# Patient Record
Sex: Female | Born: 1950
Health system: Southern US, Community
[De-identification: ages and names within clinical notes are randomized; demographics above are authoritative.]

## PROBLEM LIST (undated history)

## (undated) DIAGNOSIS — I8393 Asymptomatic varicose veins of bilateral lower extremities: Secondary | ICD-10-CM

## (undated) DIAGNOSIS — T4145XA Adverse effect of unspecified anesthetic, initial encounter: Secondary | ICD-10-CM

## (undated) DIAGNOSIS — Z9889 Other specified postprocedural states: Secondary | ICD-10-CM

## (undated) DIAGNOSIS — M169 Osteoarthritis of hip, unspecified: Secondary | ICD-10-CM

## (undated) DIAGNOSIS — R7303 Prediabetes: Secondary | ICD-10-CM

## (undated) DIAGNOSIS — E079 Disorder of thyroid, unspecified: Secondary | ICD-10-CM

## (undated) DIAGNOSIS — K5790 Diverticulosis of intestine, part unspecified, without perforation or abscess without bleeding: Secondary | ICD-10-CM

## (undated) DIAGNOSIS — C801 Malignant (primary) neoplasm, unspecified: Secondary | ICD-10-CM

## (undated) DIAGNOSIS — M1990 Unspecified osteoarthritis, unspecified site: Secondary | ICD-10-CM

## (undated) DIAGNOSIS — R112 Nausea with vomiting, unspecified: Secondary | ICD-10-CM

## (undated) DIAGNOSIS — O1490 Unspecified pre-eclampsia, unspecified trimester: Secondary | ICD-10-CM

## (undated) DIAGNOSIS — T8859XA Other complications of anesthesia, initial encounter: Secondary | ICD-10-CM

## (undated) HISTORY — PX: REDUCTION MAMMAPLASTY: SUR839

## (undated) HISTORY — DX: Disorder of thyroid, unspecified: E07.9

## (undated) HISTORY — DX: Unspecified pre-eclampsia, unspecified trimester: O14.90

## (undated) HISTORY — PX: INGUINAL HERNIA REPAIR: SUR1180

## (undated) HISTORY — DX: Asymptomatic varicose veins of bilateral lower extremities: I83.93

## (undated) HISTORY — DX: Diverticulosis of intestine, part unspecified, without perforation or abscess without bleeding: K57.90

## (undated) HISTORY — PX: KNEE ARTHROSCOPY: SHX127

## (undated) HISTORY — PX: BREAST BIOPSY: SHX20

## (undated) HISTORY — PX: COLON SURGERY: SHX602

## (undated) HISTORY — PX: COLONOSCOPY: SHX174

## (undated) HISTORY — PX: APPENDECTOMY: SHX54

## (undated) HISTORY — PX: THYROID SURGERY: SHX805

## (undated) HISTORY — PX: OTHER SURGICAL HISTORY: SHX169

## (undated) HISTORY — PX: BREAST REDUCTION SURGERY: SHX8

---

## 1997-12-04 ENCOUNTER — Encounter: Admission: RE | Admit: 1997-12-04 | Discharge: 1997-12-04 | Payer: Self-pay | Admitting: Family Medicine

## 1998-03-30 ENCOUNTER — Encounter: Admission: RE | Admit: 1998-03-30 | Discharge: 1998-03-30 | Payer: Self-pay | Admitting: Family Medicine

## 1998-04-27 ENCOUNTER — Encounter: Admission: RE | Admit: 1998-04-27 | Discharge: 1998-04-27 | Payer: Self-pay | Admitting: Family Medicine

## 1998-12-31 ENCOUNTER — Encounter: Admission: RE | Admit: 1998-12-31 | Discharge: 1998-12-31 | Payer: Self-pay | Admitting: Family Medicine

## 1999-02-04 ENCOUNTER — Encounter: Admission: RE | Admit: 1999-02-04 | Discharge: 1999-02-04 | Payer: Self-pay | Admitting: Family Medicine

## 1999-03-12 ENCOUNTER — Encounter: Payer: Self-pay | Admitting: Family Medicine

## 1999-03-12 ENCOUNTER — Encounter: Admission: RE | Admit: 1999-03-12 | Discharge: 1999-03-12 | Payer: Self-pay | Admitting: Family Medicine

## 1999-07-25 ENCOUNTER — Encounter: Admission: RE | Admit: 1999-07-25 | Discharge: 1999-07-25 | Payer: Self-pay | Admitting: Family Medicine

## 1999-12-27 ENCOUNTER — Encounter: Admission: RE | Admit: 1999-12-27 | Discharge: 1999-12-27 | Payer: Self-pay | Admitting: Family Medicine

## 1999-12-27 ENCOUNTER — Encounter: Payer: Self-pay | Admitting: Family Medicine

## 1999-12-30 ENCOUNTER — Encounter: Admission: RE | Admit: 1999-12-30 | Discharge: 1999-12-30 | Payer: Self-pay | Admitting: Family Medicine

## 2000-11-09 ENCOUNTER — Encounter: Payer: Self-pay | Admitting: Family Medicine

## 2000-11-09 ENCOUNTER — Encounter: Admission: RE | Admit: 2000-11-09 | Discharge: 2000-11-09 | Payer: Self-pay | Admitting: Family Medicine

## 2002-01-28 ENCOUNTER — Encounter: Admission: RE | Admit: 2002-01-28 | Discharge: 2002-01-28 | Payer: Self-pay | Admitting: Family Medicine

## 2002-01-28 ENCOUNTER — Encounter: Payer: Self-pay | Admitting: Sports Medicine

## 2002-01-28 ENCOUNTER — Encounter: Admission: RE | Admit: 2002-01-28 | Discharge: 2002-01-28 | Payer: Self-pay | Admitting: Sports Medicine

## 2003-03-03 ENCOUNTER — Encounter: Admission: RE | Admit: 2003-03-03 | Discharge: 2003-03-03 | Payer: Self-pay | Admitting: Family Medicine

## 2003-03-03 ENCOUNTER — Encounter: Admission: RE | Admit: 2003-03-03 | Discharge: 2003-03-03 | Payer: Self-pay | Admitting: Sports Medicine

## 2003-03-07 ENCOUNTER — Encounter: Admission: RE | Admit: 2003-03-07 | Discharge: 2003-03-07 | Payer: Self-pay | Admitting: Family Medicine

## 2004-03-15 ENCOUNTER — Ambulatory Visit: Payer: Self-pay | Admitting: Family Medicine

## 2004-03-15 ENCOUNTER — Encounter: Admission: RE | Admit: 2004-03-15 | Discharge: 2004-03-15 | Payer: Self-pay | Admitting: Sports Medicine

## 2004-03-28 ENCOUNTER — Encounter (INDEPENDENT_AMBULATORY_CARE_PROVIDER_SITE_OTHER): Payer: Self-pay | Admitting: Specialist

## 2004-03-28 ENCOUNTER — Ambulatory Visit (HOSPITAL_COMMUNITY): Admission: RE | Admit: 2004-03-28 | Discharge: 2004-03-28 | Payer: Self-pay | Admitting: Gastroenterology

## 2004-04-05 ENCOUNTER — Ambulatory Visit: Payer: Self-pay | Admitting: Sports Medicine

## 2005-02-17 ENCOUNTER — Encounter (INDEPENDENT_AMBULATORY_CARE_PROVIDER_SITE_OTHER): Payer: Self-pay | Admitting: *Deleted

## 2005-03-17 ENCOUNTER — Encounter: Admission: RE | Admit: 2005-03-17 | Discharge: 2005-03-17 | Payer: Self-pay | Admitting: Family Medicine

## 2005-03-17 ENCOUNTER — Encounter: Payer: Self-pay | Admitting: Family Medicine

## 2005-03-17 ENCOUNTER — Ambulatory Visit: Payer: Self-pay | Admitting: Family Medicine

## 2006-03-06 ENCOUNTER — Ambulatory Visit: Payer: Self-pay | Admitting: Family Medicine

## 2006-03-23 ENCOUNTER — Ambulatory Visit: Payer: Self-pay | Admitting: Family Medicine

## 2006-04-16 DIAGNOSIS — M479 Spondylosis, unspecified: Secondary | ICD-10-CM | POA: Insufficient documentation

## 2006-04-16 DIAGNOSIS — E7800 Pure hypercholesterolemia, unspecified: Secondary | ICD-10-CM

## 2006-04-16 DIAGNOSIS — F070 Personality change due to known physiological condition: Secondary | ICD-10-CM

## 2006-04-16 DIAGNOSIS — E039 Hypothyroidism, unspecified: Secondary | ICD-10-CM

## 2006-04-16 DIAGNOSIS — K5732 Diverticulitis of large intestine without perforation or abscess without bleeding: Secondary | ICD-10-CM | POA: Insufficient documentation

## 2006-04-17 ENCOUNTER — Encounter (INDEPENDENT_AMBULATORY_CARE_PROVIDER_SITE_OTHER): Payer: Self-pay | Admitting: *Deleted

## 2007-01-28 ENCOUNTER — Ambulatory Visit (HOSPITAL_COMMUNITY): Admission: RE | Admit: 2007-01-28 | Discharge: 2007-01-28 | Payer: Self-pay | Admitting: Family Medicine

## 2007-01-28 ENCOUNTER — Encounter: Payer: Self-pay | Admitting: Family Medicine

## 2007-01-28 ENCOUNTER — Encounter (INDEPENDENT_AMBULATORY_CARE_PROVIDER_SITE_OTHER): Payer: Self-pay | Admitting: *Deleted

## 2007-01-28 ENCOUNTER — Ambulatory Visit: Payer: Self-pay | Admitting: Family Medicine

## 2007-01-28 ENCOUNTER — Encounter (INDEPENDENT_AMBULATORY_CARE_PROVIDER_SITE_OTHER): Payer: Self-pay | Admitting: Family Medicine

## 2007-02-24 ENCOUNTER — Encounter: Payer: Self-pay | Admitting: Family Medicine

## 2007-02-25 ENCOUNTER — Encounter (INDEPENDENT_AMBULATORY_CARE_PROVIDER_SITE_OTHER): Payer: Self-pay | Admitting: *Deleted

## 2007-03-26 ENCOUNTER — Encounter: Payer: Self-pay | Admitting: Family Medicine

## 2007-03-26 ENCOUNTER — Ambulatory Visit: Payer: Self-pay | Admitting: Family Medicine

## 2007-03-26 ENCOUNTER — Encounter: Admission: RE | Admit: 2007-03-26 | Discharge: 2007-03-26 | Payer: Self-pay | Admitting: Family Medicine

## 2007-03-26 LAB — CONVERTED CEMR LAB
Albumin: 4.7 g/dL (ref 3.5–5.2)
Alkaline Phosphatase: 58 units/L (ref 39–117)
BUN: 19 mg/dL (ref 6–23)
Creatinine, Ser: 0.84 mg/dL (ref 0.40–1.20)
HDL: 71 mg/dL (ref >39)
Pap Smear: NORMAL
Total Bilirubin: 0.6 mg/dL (ref 0.3–1.2)
Total Protein: 7.7 g/dL (ref 6.0–8.3)
VLDL: 23 mg/dL (ref 0–40)

## 2007-03-29 ENCOUNTER — Encounter: Payer: Self-pay | Admitting: Family Medicine

## 2007-04-12 ENCOUNTER — Encounter: Payer: Self-pay | Admitting: Family Medicine

## 2007-04-13 ENCOUNTER — Encounter: Payer: Self-pay | Admitting: Family Medicine

## 2007-04-13 ENCOUNTER — Ambulatory Visit (HOSPITAL_COMMUNITY): Admission: RE | Admit: 2007-04-13 | Discharge: 2007-04-13 | Payer: Self-pay | Admitting: Sports Medicine

## 2007-08-24 ENCOUNTER — Ambulatory Visit: Payer: Self-pay | Admitting: Family Medicine

## 2007-08-25 LAB — CONVERTED CEMR LAB: Direct LDL: 127 mg/dL — ABNORMAL HIGH

## 2007-11-30 ENCOUNTER — Ambulatory Visit: Payer: Self-pay | Admitting: Occupational Medicine

## 2008-03-20 ENCOUNTER — Telehealth (INDEPENDENT_AMBULATORY_CARE_PROVIDER_SITE_OTHER): Payer: Self-pay | Admitting: Family Medicine

## 2008-04-05 ENCOUNTER — Encounter: Payer: Self-pay | Admitting: Family Medicine

## 2008-04-05 LAB — CONVERTED CEMR LAB
OCCULT 1: NEGATIVE
OCCULT 3: NEGATIVE

## 2008-04-14 ENCOUNTER — Encounter: Admission: RE | Admit: 2008-04-14 | Discharge: 2008-04-14 | Payer: Self-pay | Admitting: Family Medicine

## 2008-04-14 ENCOUNTER — Ambulatory Visit: Payer: Self-pay | Admitting: Family Medicine

## 2008-04-14 DIAGNOSIS — N959 Unspecified menopausal and perimenopausal disorder: Secondary | ICD-10-CM | POA: Insufficient documentation

## 2008-04-18 ENCOUNTER — Ambulatory Visit: Payer: Self-pay | Admitting: Family Medicine

## 2008-04-18 ENCOUNTER — Encounter: Payer: Self-pay | Admitting: Family Medicine

## 2008-04-18 LAB — CONVERTED CEMR LAB
ALT: 20 units/L (ref 0–35)
AST: 21 units/L (ref 0–37)
Albumin: 4.5 g/dL (ref 3.5–5.2)
BUN: 22 mg/dL (ref 6–23)
CO2: 23 meq/L (ref 19–32)
Glucose, Bld: 94 mg/dL (ref 70–99)
LDL Cholesterol: 120 mg/dL — ABNORMAL HIGH (ref 0–99)
Sodium: 143 meq/L (ref 135–145)
TSH: 0.654 microintl units/mL (ref 0.350–4.50)
Total Bilirubin: 0.6 mg/dL (ref 0.3–1.2)
Total Protein: 6.9 g/dL (ref 6.0–8.3)
Triglycerides: 61 mg/dL (ref <150)
VLDL: 12 mg/dL (ref 0–40)
Vit D, 25-Hydroxy: 30 ng/mL (ref 30–89)

## 2008-11-29 ENCOUNTER — Ambulatory Visit: Payer: Self-pay | Admitting: Family Medicine

## 2009-02-28 ENCOUNTER — Ambulatory Visit: Payer: Self-pay | Admitting: Family Medicine

## 2009-04-03 ENCOUNTER — Encounter: Payer: Self-pay | Admitting: Family Medicine

## 2009-04-13 ENCOUNTER — Ambulatory Visit: Payer: Self-pay | Admitting: Family Medicine

## 2009-04-13 LAB — CONVERTED CEMR LAB
AST: 27 units/L (ref 0–37)
Alkaline Phosphatase: 49 units/L (ref 39–117)
BUN: 20 mg/dL (ref 6–23)
Creatinine, Ser: 0.74 mg/dL (ref 0.40–1.20)
Glucose, Bld: 94 mg/dL (ref 70–99)
HDL: 72 mg/dL (ref >39)
Hemoglobin: 13.2 g/dL (ref 12.0–15.0)
LDL Cholesterol: 117 mg/dL — ABNORMAL HIGH (ref 0–99)
Platelets: 344 10*3/uL (ref 150–400)
Potassium: 4.4 meq/L (ref 3.5–5.3)
RBC: 4.5 M/uL (ref 3.87–5.11)
TSH: 0.53 microintl units/mL (ref 0.350–4.500)
Total Bilirubin: 0.4 mg/dL (ref 0.3–1.2)
Total CHOL/HDL Ratio: 2.9
VLDL: 17 mg/dL (ref 0–40)
WBC: 4.2 10*3/uL (ref 4.0–10.5)

## 2009-11-30 ENCOUNTER — Encounter: Payer: Self-pay | Admitting: Family Medicine

## 2009-12-18 ENCOUNTER — Encounter: Payer: Self-pay | Admitting: Family Medicine

## 2010-03-21 NOTE — Assessment & Plan Note (Signed)
Summary: cpe,df   Vital Signs:  Patient profile:   60 year old female Weight:      157.3 pounds BMI:     26.27 Temp:     97.7 degrees F Pulse rate:   69 / minute BP sitting:   128 / 86  Vitals Entered By: Loralee Pacas CMA (April 13, 2009 10:32 AM)  Serial Vital Signs/Assessments:  Time      Position  BP       Pulse  Resp  Temp     By                     128/86                         Doralee Albino MD   CC:  physical.  History of Present Illness: Feels very good.  Contoling arthritis and borderline Hbp with diet and exercise.  Home BPs have been typically lower than 120/80.  Wants shingles vaccine when turns 59 in 6 months  Some stress - husband with metastatic prostate cancer.  She is managing.  Habits & Providers  Alcohol-Tobacco-Diet     Alcohol drinks/day: 0     Tobacco Status: never     Diet Comments: very health  Exercise-Depression-Behavior     Does Patient Exercise: yes     Exercise Counseling: not indicated; exercise is adequate     Have you felt down or hopeless? no     Have you felt little pleasure in things? no     STD Risk: never     Drug Use: never     Seat Belt Use: always     Sun Exposure: infrequent  Current Medications (verified): 1)  Multivital Performance  Tabs (Multiple Vitamins-Minerals) .... Take 1 Tablet By Mouth Once A Day 2)  Sm Calcium/vitamin D 500-200 Mg-Unit Tabs (Calcium Carbonate-Vitamin D) .... Take 1 Tablet By Mouth Twice A Day  Allergies (verified): No Known Drug Allergies  Past History:  Past medical, surgical, family and social histories (including risk factors) reviewed, and no changes noted (except as noted below).  Past Medical History: Reviewed history from 04/16/2006 and no changes required. decreased libido, Degenerative disk disease of neck  Past Surgical History: Reviewed history from 04/16/2006 and no changes required. bone density 11/98 T=0.63 -, partial thyroidectomy -, Rt. Colectomy for ruptured  diverticulitis 1994 -  Family History: Reviewed history from 01/28/2007 and no changes required. dementia No heart disease  Social History: Reviewed history from 02/28/2009 and no changes required. non smoker; ; walks and swims; owns sewing business;  Married Never Smoked Does Patient Exercise:  yes STD Risk:  never Drug Use:  never Risk analyst Use:  always Sun Exposure-Excessive:  infrequent  Physical Exam  General:  Well-developed,well-nourished,in no acute distress; alert,appropriate and cooperative throughout examination Head:  Normocephalic and atraumatic without obvious abnormalities. No apparent alopecia or balding. Ears:  External ear exam shows no significant lesions or deformities.  Otoscopic examination reveals clear canals, tympanic membranes are intact bilaterally without bulging, retraction, inflammation or discharge. Hearing is grossly normal bilaterally. Mouth:  Oral mucosa and oropharynx without lesions or exudates.  Teeth in good repair. Neck:  No deformities, masses, or tenderness noted. Lungs:  Normal respiratory effort, chest expands symmetrically. Lungs are clear to auscultation, no crackles or wheezes. Heart:  Normal rate and regular rhythm. S1 and S2 normal without gallop, murmur, click, rub or other extra sounds. Abdomen:  Bowel  sounds positive,abdomen soft and non-tender without masses, organomegaly or hernias noted. Msk:  No deformity or scoliosis noted of thoracic or lumbar spine.   Pulses:  R and L carotid,radial,femoral,dorsalis pedis and posterior tibial pulses are full and equal bilaterally Extremities:  No clubbing, cyanosis, edema, or deformity noted with normal full range of motion of all joints.   Neurologic:  No cranial nerve deficits noted. Station and gait are normal. Plantar reflexes are down-going bilaterally. DTRs are symmetrical throughout. Sensory, motor and coordinative functions appear intact.   Impression & Recommendations:  Problem #  1:  Preventive Health Care (ICD-V70.0) Very healthy woman with great habits. Will get shingles vaccine when she turns 59  Problem # 2:  HYPOTHYROIDISM, UNSPECIFIED (ICD-244.9) check tsh Orders: TSH-[facility] ([id number]) [facility] - Est  40-64 yrs ([address])  Problem # 3:  MEMORY LOSS (ICD-310.1) memory loss has stabilized.  Still has minor concerns over her memory. Orders: Comp Met-[facility] [phone]) CBC-[facility] (91478) [facility] - Est  40-64 yrs ([address])  Problem # 4:  HYPERCHOLESTEROLEMIA (ICD-272.0) Time to check labs Orders: Lipid-[facility] ([id number]) [facility] - Est  40-64 yrs (130[address])  Complete Medication List: 1)  Multivital Performance Tabs (Multiple vitamins-minerals) .... Take 1 tablet by mouth once a day 2)  Sm Calcium/vitamin D 500-200 Mg-unit Tabs (Calcium carbonate-vitamin d) .... Take 1 tablet by mouth twice a day   Prevention & Chronic Care Immunizations   Influenza vaccine: Fluvax 3+  (11/29/2008)   Influenza vaccine due: 10/18/2009    Tetanus booster: 01/17/2002: Done.   Tetanus booster due: 01/18/2012    Pneumococcal vaccine: Done.  (02/18/2004)   Pneumococcal vaccine due: None  Colorectal Screening   Hemoccult: normal  (04/06/2008)   Hemoccult due: Not Indicated    Colonoscopy: Done.  (03/20/2004)   Colonoscopy due: 03/20/2014  Other Screening   Pap smear: normal  (03/26/2007)   Pap smear due: 03/2010    Mammogram: Assessment: BIRADS 1.   (04/03/2009)   Mammogram due: 03/2010   Smoking status: never  (04/13/2009)  Lipids   Total Cholesterol: 195  (04/18/2008)   LDL: 120  (04/18/2008)   LDL Direct: 127  (08/24/2007)   HDL: 63  (04/18/2008)   Triglycerides: 61  (04/18/2008)    SGOT (AST): 21  (04/18/2008)   SGPT (ALT): 20  (04/18/2008) CMP ordered    Alkaline phosphatase: 48  (04/18/2008)   Total bilirubin: 0.6  (04/18/2008)    Lipid flowsheet reviewed?: Yes   Progress toward LDL goal: At goal  Self-Management Support :    Lipid self-management support:  Written self-care plan  (04/13/2009)   Lipid self-care plan printed.

## 2010-03-21 NOTE — Miscellaneous (Signed)
Summary: Outside immunizations.  Clinical Lists Changes  Orders: Added new Service order of Zoster (Shingles) Vaccine Live [phone]) - Signed Added new Service order of Admin 1st Vaccine (60454) - Signed Observations: Added new observation of ZOSTAVAX EXP: 12/14/2009 (12/18/2009 15:31) Added new observation of ZOSTAVAX: Zostavax (12/18/2009 15:31) Added new observation of DM PROGRESS: N/A (12/18/2009 15:31) Added new observation of DM FSREVIEW: N/A (12/18/2009 15:31) Added new observation of HTN PROGRESS: N/A (12/18/2009 15:31) Added new observation of HTN FSREVIEW: N/A (12/18/2009 15:31) Added new observation of FLU VAX: Historical (12/05/2009 15:34)  Immunization History:  Influenza Immunization History:    Influenza:  historical (12/05/2009)  Immunizations Administered:  Zostavax # 0:    Vaccine Type: Zostavax    Exp. Date: 12/14/2009       Prevention & Chronic Care Immunizations   Influenza vaccine: Historical  (12/05/2009)   Influenza vaccine due: 10/18/2009    Tetanus booster: 01/17/2002: Done.   Tetanus booster due: 01/18/2012    Pneumococcal vaccine: Done.  (02/18/2004)   Pneumococcal vaccine due: None    H. zoster vaccine: 12/18/2009: Zostavax  Colorectal Screening   Hemoccult: normal  (04/06/2008)   Hemoccult due: Not Indicated    Colonoscopy: Done.  (03/20/2004)   Colonoscopy due: 03/20/2014  Other Screening   Pap smear: normal  (03/26/2007)   Pap smear due: 03/2010    Mammogram: Assessment: BIRADS 1.   (04/03/2009)   Mammogram due: 03/2010    DXA bone density scan: Lumbar Spine:  T Score > -1.0 Spine.    (04/14/2008)   DXA scan due: None    Smoking status: never  (04/13/2009)  Lipids   Total Cholesterol: 206  (04/13/2009)   LDL: 117  (04/13/2009)   LDL Direct: 127  (08/24/2007)   HDL: 72  (04/13/2009)   Triglycerides: 84  (04/13/2009)    SGOT (AST): 27  (04/13/2009)   SGPT (ALT): 23  (04/13/2009)   Alkaline phosphatase: 49   (04/13/2009)   Total bilirubin: 0.4  (04/13/2009)  Self-Management Support :    Lipid self-management support: Written self-care plan  (04/13/2009)

## 2010-03-21 NOTE — Miscellaneous (Signed)
Summary: pharmacy wants order for zoster vaccine  Clinical Lists Changes [facility] in [place] [phone] called asking if they could give pt the shingles  vaccine. told them I will need to speak with pcp next week & will answer them next week.pharmacist states the age at which people can get it has been lowered to age 61.Marland KitchenMarland KitchenMarland KitchenGolden Circle RN  November 30, 2009 3:51 PM  OK to give, RX sent. Doralee Albino MD  December 03, 2009 1:51 PM   Medications: Added new medication of ZOSTAVAX 69629 UNT/0.65ML SOLR (ZOSTER VACCINE LIVE) Disp one dose.  OK to give - Signed Rx of ZOSTAVAX 52841 UNT/0.65ML SOLR (ZOSTER VACCINE LIVE) Disp one dose.  OK to give;  #1 x 0;  Signed;  Entered by: Doralee Albino MD;  Authorized by: Doralee Albino MD;  Method used: Electronically to [facility] [phone]*, [address], [place], [place]  [address], Ph: [phone], Fax: [phone]    Prescriptions: ZOSTAVAX 19400 UNT/0.65ML SOLR (ZOSTER VACCINE LIVE) Disp one dose.  OK to give  #1 x 0   Entered and Authorized by:   Doralee Albino MD   Signed by:   Doralee Albino MD on 12/03/2009   Method used:   Electronically to        [facility]  [facility] [phone]* (retail)       [address] [place], [place]  [address]       Ph: [phone]       Fax: [phone]   RxID:   [id number]

## 2010-03-21 NOTE — Assessment & Plan Note (Signed)
Summary: COUGH/KH   Vital Signs:  Patient Profile:   60 Years Old Female CC:      cold & URI symptomsfor one week Height:     65 inches Weight:      162 pounds O2 Sat:      97 % O2 treatment:    Room Air Temp:     97.1 degrees F oral Pulse rate:   66 / minute Pulse rhythm:   regular Resp:     17 per minute BP sitting:   139 / 91  (right arm)  Vitals Entered By: Lannie Fields (February 28, 2009 12:17 PM)                  Prior Medication List:  MULTIVITAL PERFORMANCE  TABS (MULTIPLE VITAMINS-MINERALS) Take 1 tablet by mouth once a day PENICILLIN V POTASSIUM 250 MG TABS (PENICILLIN V POTASSIUM) one tab by mouth qid for 10 days PREMARIN 0.625 MG/GM CREA (ESTROGENS, CONJUGATED) Insert 1 applicatorful into vagina once a week SM CALCIUM/VITAMIN D 500-200 MG-UNIT TABS (CALCIUM CARBONATE-VITAMIN D) Take 1 tablet by mouth twice a day ASPIRIN 81 MG  TBEC (ASPIRIN) one by mouth every day   Current Allergies (reviewed today): No known allergies History of Present Illness Chief Complaint: cold & URI symptomsfor one week History of Present Illness: Patient reports not feeling well for at least one week. She reports her husband has been  sick. Coughing that is productive and head pressure and congestion. Throat was hurting but it has stopped. She has been taking contac but she has stopped.   Current Problems: BRONCHITIS, ACUTE (ICD-466.0) ACUTE SINUSITIS, UNSPECIFIED (ICD-461.9) HEALTH MAINTENANCE EXAM (ICD-V70.0) UNSPECIFIED MENOPAUSAL&POSTMENOPAUSAL DISORDER (ICD-627.9) KNEE PAIN (ICD-719.46) ROUTINE GENERAL MEDICAL EXAM@HEALTH  CARE FACL (ICD-V70.0) SCREENING FOR MALIGNANT NEOPLASM OF THE CERVIX (ICD-V76.2) OSTEOARTHRITIS OF SPINE, NOS (ICD-721.90) MEMORY LOSS (ICD-310.1) HYPOTHYROIDISM, UNSPECIFIED (ICD-244.9) HYPERCHOLESTEROLEMIA (ICD-272.0) DIVERTICULITIS OF COLON, NOS (ICD-562.11)   Current Meds MULTIVITAL PERFORMANCE  TABS (MULTIPLE VITAMINS-MINERALS) Take 1 tablet by  mouth once a day SM CALCIUM/VITAMIN D 500-200 MG-UNIT TABS (CALCIUM CARBONATE-VITAMIN D) Take 1 tablet by mouth twice a day ASPIRIN 81 MG  TBEC (ASPIRIN) one by mouth every day AUGMENTIN 875-125 MG TABS (AMOXICILLIN-POT CLAVULANATE) 1 by mouth 2 times daily TUSSIONEX PENNKINETIC ER 8-10 MG/5ML LQCR (CHLORPHENIRAMINE-HYDROCODONE)  * ALLEGRA-D 24 HOUR 180-240 MG XR24H-TAB /OR CLARITIN D 24 HRS 1 by mouth q day  REVIEW OF SYSTEMS Constitutional Symptoms       Complains of chills and night sweats.     Denies fever, weight loss, weight gain, and fatigue.  Eyes       Denies change in vision, eye pain, eye discharge, glasses, contact lenses, and eye surgery. Ear/Nose/Throat/Mouth       Complains of frequent runny nose.      Denies hearing loss/aids, change in hearing, ear pain, ear discharge, dizziness, frequent nose bleeds, sinus problems, sore throat, hoarseness, and tooth pain or bleeding.  Respiratory       Complains of productive cough, wheezing, and shortness of breath.      Denies dry cough, asthma, bronchitis, and emphysema/COPD.  Cardiovascular       Complains of chest pain.      Denies murmurs and tires easily with exhertion.    Gastrointestinal       Denies stomach pain, nausea/vomiting, diarrhea, constipation, blood in bowel movements, and indigestion. Genitourniary       Denies painful urination, kidney stones, and loss of urinary control. Neurological  Denies paralysis, seizures, and fainting/blackouts. Musculoskeletal       Denies muscle pain, joint pain, joint stiffness, decreased range of motion, redness, swelling, muscle weakness, and gout.  Skin       Denies bruising, unusual mles/lumps or sores, and hair/skin or nail changes.  Psych       Denies mood changes, temper/anger issues, anxiety/stress, speech problems, depression, and sleep problems.  Past History:  Family History: Last updated: 01/28/2007 dementia No heart disease  Social History: Last updated:  02/28/2009 non smoker; drinks moderately; walks and swims; owns sewing business;  Married Never Smoked  Risk Factors: Smoking Status: never (02/28/2009)  Past Medical History: Reviewed history from 04/16/2006 and no changes required. decreased libido, Degenerative disk disease of neck  Past Surgical History: Reviewed history from 04/16/2006 and no changes required. bone density 11/98 T=0.63 -, partial thyroidectomy -, Rt. Colectomy for ruptured diverticulitis 1994 -  Family History: Reviewed history from 01/28/2007 and no changes required. dementia No heart disease  Social History: Reviewed history from 04/16/2006 and no changes required. non smoker; drinks moderately; walks and swims; owns sewing business;  Married Never Smoked Physical Exam General appearance: well developed, well nourished, ill appearing Head: normocephalic, atraumatic Ears: normal, no lesions or deformities Nasal: pale, boggy, swollen nasal turbinates tenderness over maxillary sinus Oral/Pharynx: pharyngeal erythema with exudate, uvula midline without deviation Neck: neck supple,  trachea midline, no masses Chest/Lungs: no rales, wheezes, or rhonchi bilateral, breath sounds equal without effort Skin: no obvious rashes or lesions MSE: oriented to time, place, and person Assessment New Problems: BRONCHITIS, ACUTE (ICD-466.0) ACUTE SINUSITIS, UNSPECIFIED (ICD-461.9)  sinusitis w/early bronchitis  Plan New Medications/Changes: ALLEGRA-D 24 HOUR 180-240 MG XR24H-TAB /OR CLARITIN D 24 HRS 1 by mouth q day  #20 x 0, 02/28/2009, Hassan Rowan MD TUSSIONEX PENNKINETIC ER 8-10 MG/5ML LQCR (CHLORPHENIRAMINE-HYDROCODONE)  #33fl oz x 0, 02/28/2009, Hassan Rowan MD AUGMENTIN 875-125 MG TABS (AMOXICILLIN-POT CLAVULANATE) 1 by mouth 2 times daily  #20 x 0, 02/28/2009, Hassan Rowan MD  New Orders: Est. Patient Level III [phone] Planning Comments:   as directed  Follow Up: Follow up in 2-3 days if no improvement,  Follow up on an as needed basis, Follow up with Primary Physician  The patient and/or caregiver has been counseled thoroughly with regard to medications prescribed including dosage, schedule, interactions, rationale for use, and possible side effects and they verbalize understanding.  Diagnoses and expected course of recovery discussed and will return if not improved as expected or if the condition worsens. Patient and/or caregiver verbalized understanding.  Prescriptions: ALLEGRA-D 24 HOUR 180-240 MG XR24H-TAB /OR CLARITIN D 24 HRS 1 by mouth q day  #20 x 0   Entered and Authorized by:   Hassan Rowan MD   Signed by:   Hassan Rowan MD on 02/28/2009   Method used:   Printed then faxed to ...       [facility]  [facility] [phone]* (retail)       [address] [place], [place]  [address]       Ph: [phone]       Fax: [phone]   RxID:   [id number] Sandria Senter ER 8-10 MG/5ML LQCR (CHLORPHENIRAMINE-HYDROCODONE)   #28fl oz x 0   Entered and Authorized by:   Hassan Rowan MD   Signed by:   Hassan Rowan MD on 02/28/2009   Method used:   Printed then faxed to .Marland KitchenMarland Kitchen  [facility]  [facility] [phone]* (retail)       [address] [place], [place]  [address]       Ph: [phone]       Fax: [phone]   RxID:   [id number] AUGMENTIN 875-125 MG TABS (AMOXICILLIN-POT CLAVULANATE) 1 by mouth 2 times daily  #20 x 0   Entered and Authorized by:   Hassan Rowan MD   Signed by:   Hassan Rowan MD on 02/28/2009   Method used:   Printed then faxed to ...       [facility]  [facility] [phone]* (retail)       [address] [place], [place]  [address]       Ph: [phone]       Fax: [phone]   RxID:   [id number]   Patient Instructions: 1)  Please schedule a follow-up appointment in 1-2 weeks. 2)  Please schedule an appointment with your primary doctor in :1-2 weeks 3)  Take your antibiotic as prescribed until ALL of it is gone, but stop if you develop a rash or swelling and contact our  office as soon as possible. 4)  Acute sinusitis symptoms for less than 10 days are not helped by antibiotics.Use warm moist compresses, and over the counter decongestants ( only as directed). Call if no improvement in 5-7 days, sooner if increasing pain, fever, or new symptoms. 5)  Acute bronchitis symptoms for less than 10 days are not helped by antibiotics. take over the counter cough medications. call if no improvment in  5-7 days, sooner if increasing cough, fever, or new symptoms( shortness of breath, chest pain).

## 2010-04-05 ENCOUNTER — Telehealth: Payer: Self-pay | Admitting: Family Medicine

## 2010-04-15 NOTE — Telephone Encounter (Signed)
Mailed out.Tammy Castro, Tammy Castro

## 2010-04-22 ENCOUNTER — Encounter: Payer: Self-pay | Admitting: Family Medicine

## 2010-04-22 DIAGNOSIS — K921 Melena: Secondary | ICD-10-CM

## 2010-04-22 LAB — HEMOCCULT GUIAC POC 1CARD (OFFICE)

## 2010-04-24 ENCOUNTER — Ambulatory Visit (INDEPENDENT_AMBULATORY_CARE_PROVIDER_SITE_OTHER): Payer: BC Managed Care – PPO | Admitting: Family Medicine

## 2010-04-24 ENCOUNTER — Encounter: Payer: Self-pay | Admitting: Family Medicine

## 2010-04-24 VITALS — BP 119/82 | HR 67 | Temp 97.5°F | Ht 65.0 in | Wt 161.0 lb

## 2010-04-24 DIAGNOSIS — Z0000 Encounter for general adult medical examination without abnormal findings: Secondary | ICD-10-CM

## 2010-04-24 DIAGNOSIS — E7800 Pure hypercholesterolemia, unspecified: Secondary | ICD-10-CM

## 2010-04-24 DIAGNOSIS — Z124 Encounter for screening for malignant neoplasm of cervix: Secondary | ICD-10-CM

## 2010-04-24 DIAGNOSIS — E039 Hypothyroidism, unspecified: Secondary | ICD-10-CM

## 2010-04-24 LAB — CONVERTED CEMR LAB
Alkaline Phosphatase: 54 units/L (ref 39–117)
CO2: 25 meq/L (ref 19–32)
Calcium: 9.7 mg/dL (ref 8.4–10.5)
Creatinine, Ser: 0.75 mg/dL (ref 0.40–1.20)
HDL: 74 mg/dL (ref >39)
MCHC: 32.3 g/dL (ref 30.0–36.0)
MCV: 92.1 fL (ref 78.0–100.0)
Potassium: 4.4 meq/L (ref 3.5–5.3)
RBC: 4.67 M/uL (ref 3.87–5.11)
RDW: 14.2 % (ref 11.5–15.5)
Total Bilirubin: 0.5 mg/dL (ref 0.3–1.2)
Triglycerides: 121 mg/dL (ref <150)
VLDL: 24 mg/dL (ref 0–40)
WBC: 4.4 10*3/uL (ref 4.0–10.5)

## 2010-04-24 LAB — COMPREHENSIVE METABOLIC PANEL
ALT: 30 U/L (ref 0–35)
Alkaline Phosphatase: 54 U/L (ref 39–117)
CO2: 25 mEq/L (ref 19–32)
Glucose, Bld: 90 mg/dL (ref 70–99)
Potassium: 4.4 mEq/L (ref 3.5–5.3)
Sodium: 139 mEq/L (ref 135–145)
Total Bilirubin: 0.5 mg/dL (ref 0.3–1.2)
Total Protein: 7.3 g/dL (ref 6.0–8.3)

## 2010-04-24 LAB — LIPID PANEL
HDL: 74 mg/dL (ref >39)
LDL Cholesterol: 138 mg/dL — ABNORMAL HIGH (ref 0–99)
Total CHOL/HDL Ratio: 3.2 Ratio
Triglycerides: 121 mg/dL (ref <150)

## 2010-04-25 ENCOUNTER — Encounter: Payer: Self-pay | Admitting: Family Medicine

## 2010-04-25 LAB — CBC
MCHC: 32.3 g/dL (ref 30.0–36.0)
MCV: 92.1 fL (ref 78.0–100.0)
Platelets: 328 10*3/uL (ref 150–400)
RBC: 4.67 MIL/uL (ref 3.87–5.11)
WBC: 4.4 10*3/uL (ref 4.0–10.5)

## 2010-04-26 DIAGNOSIS — Z0000 Encounter for general adult medical examination without abnormal findings: Secondary | ICD-10-CM | POA: Insufficient documentation

## 2010-04-26 NOTE — Progress Notes (Signed)
  Subjective:    Patient ID: Tammy Castro, female    DOB: 1950-12-12, 60 y.o.   MRN: [id number]  HPI Doing well.  Exercising.  Memory loss not an issue.  Controling Chol with diet.  Mernopausal    Review of Systems Denies wt change, SOB, Chest pain, abd pain, change in bowel or bladder or bleeding.  No suspicious skin lesion     Objective:   Physical Exam HEENT nl Neck supple without masses or thyromegally Lungs clear Cardiac RRR without m or g Abd benigh Pelvic normal Pap done Ext no edema Neuro normal       Assessment & Plan:

## 2010-04-26 NOTE — Assessment & Plan Note (Signed)
Check TSH 

## 2010-04-26 NOTE — Assessment & Plan Note (Signed)
Check labs 

## 2010-07-05 NOTE — Op Note (Signed)
NAMEJOLI, KOOB               ACCOUNT NO.:  [id number]   MEDICAL RECORD NO.:  [id number]          PATIENT TYPE:  AMB   LOCATION:  ENDO                         FACILITY:  [facility]   PHYSICIAN:  Petra Kuba, M.D.    DATE OF BIRTH:  1950-12-23   DATE OF PROCEDURE:  03/28/2004  DATE OF DISCHARGE:                                 OPERATIVE REPORT   PROCEDURE:  Colonoscopy with biopsy.   ENDOSCOPIST:  Petra Kuba, M.D.   ANESTHESIA:  Demerol 100, Versed 10.   INDICATIONS FOR PROCEDURE:  Guaiac positivity.  Consent was signed after  risks and benefits, methods, options were thoroughly discussed in the office  day.   DESCRIPTION OF PROCEDURE:  Rectal inspection was pertinent for external  hemorrhoids. Small digital exam was negative.  Video pediatric adjustable  colonoscope was inserted,  and with abdominal pressure, advanced to the anastomosis.  No position  changes were needed. The anastomosis appeared to be a small bowel and colon  side anastomosis. No signs of bleeding were seen on insertion. A rare left-  sided and a few right-sided diverticula were seen on insertion but no other  abnormalities.  Unfortunately, based on the angle of the anastomosis, we  could not advance deep into the terminal ileum, so we elected to withdraw.  The prep was adequate.  There was some liquid stool that required washing  and suctioning. On slow withdrawal through the colon, other than the  diverticula mentioned above, a small, mid-descending polyp was seen versus  just an abnormal fold, and 2 cold biopsies were obtained. Scope was slowly  withdrawn, no other polypoid lesions were seen.  As we slowly withdrew back  to the rectum, a few left-sided diverticula, tiny were seen.  Anorectal pull-  through on retroflexion confirmed some small hemorrhoids. Scope was turned,  readvanced a short ways up the left side of the colon. Air was suctioned,  scope was removed. The patient tolerated the procedure  well. There were no  obvious immediate complications.   ENDOSCOPIC DIAGNOSES:  1.  Internal and external small hemorrhoids.  2.  Few left and occasional right diverticula.  3.  Questionable mid-descending polyp, cold biopsied.  4.  Small bowel end to colon side anastomosis, unable to enter.  5.  No blood seen.   PLAN:  Await pathology, probably recheck colon screening in 5 years.  Continue work-up with an EGD.      MEM/MEDQ  D:  03/28/2004  T:  03/28/2004  Job:  [id number]   cc:   William A. Leveda Anna, M.D.  Fax: [phone]

## 2010-07-05 NOTE — Op Note (Signed)
NAMEMIKEISHA, LEMONDS               ACCOUNT NO.:  [id number]   MEDICAL RECORD NO.:  [id number]          PATIENT TYPE:  AMB   LOCATION:  ENDO                         FACILITY:  [facility]   PHYSICIAN:  Petra Kuba, M.D.    DATE OF BIRTH:  1951-01-16   DATE OF PROCEDURE:  03/28/2004  DATE OF DISCHARGE:                                 OPERATIVE REPORT   PROCEDURE:  Esophagogastroduodenoscopy.   ENDOSCOPIST:  Petra Kuba, M.D.   ANESTHESIA:  Additional medicine for this procedure:  Versed 2 only.   INDICATIONS FOR PROCEDURE:  Guaiac positivity, nondiagnostic colonoscopy.  Consent was signed after risks and benefits, methods, options were  thoroughly discussed in the office.   DESCRIPTION OF PROCEDURE:  The video endoscope was inserted by direct  vision.  Proximal and mid esophagus was normal.  She did have a small hiatal  hernia. Scope was passed into the stomach, advanced through a normal antrum,  normal pylorus into a normal duodenal bulb and around the C-loop to the  normal second portion of duodenum.  She did have a periampullary  diverticula. Quick look at the ampulla appeared normal. We were able to  advance to approximately the third portion of the duodenum, which again  revealed a diverticula but no other abnormalities or signs of bleeding.   Scope was slowly withdrawn back to the bulb, no additional findings were  seen. The bulb was normal. Scope was withdrawn back to the stomach and  retroflexed.  Cardia, fundus, angularis, lesser and greater curve were  normal on retroflex visualization, except for some mild gastritis.  Straight  visualization of the stomach did not reveal any additional findings. Air was  suctioned, scope was slowly withdrawn.  Again, a good look at the esophagus  was normal. Scope was removed.  The patient tolerated the procedure well.  There were no obvious immediate complications.   ENDOSCOPIC DIAGNOSES:  1.  Small hiatal hernia.  2.  Minimal  gastritis.  3.  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  4.  Otherwise normal EGD without any blood being seen.   PLAN:  Follow-up in 4-6 weeks to recheck symptoms, CBC and guaiacs and make  sure small-bowel follow-through or other work-up plans does not need to be  sure.  Happy to see her back p.r.n.  Work-up will leave to Dr. Leveda Anna and  medical team.      MEM/MEDQ  D:  03/28/2004  T:  03/28/2004  Job:  [id number]   cc:   William A. Leveda Anna, M.D.  Fax: [phone]

## 2010-07-23 ENCOUNTER — Telehealth: Payer: Self-pay | Admitting: Family Medicine

## 2010-07-23 NOTE — Telephone Encounter (Signed)
Tammy Castro called to ask for an antibiotic of amoxicillin for her throat that she said you would call this in for her because you know of the situation she always have with it. I told her you may need her to be seen first, since there was nothing in her chart to show we prescribed med. She still want you to contact her and let her know if you could see her sometime this week.

## 2010-07-24 MED ORDER — AMOXICILLIN 500 MG PO CAPS: 500.0000 mg | ORAL_CAPSULE | Freq: Two times a day (BID) | ORAL | Status: AC

## 2010-07-24 NOTE — Telephone Encounter (Signed)
Addended by: Tivis Ringer on: 07/24/2010 01:06 PM   Modules accepted: Orders

## 2010-09-06 ENCOUNTER — Telehealth: Payer: Self-pay | Admitting: Family Medicine

## 2010-09-06 NOTE — Telephone Encounter (Signed)
Tammy Castro need you to sign off on procedure she will be having with Dr. Francene Castle in October. Will need to send this to his office by fax.  The number is [phone].   [name] said that if you have difficulty getting this to them you can send her file to her and she will take to them.

## 2010-09-09 ENCOUNTER — Encounter: Payer: Self-pay | Admitting: Family Medicine

## 2010-09-09 DIAGNOSIS — I8390 Asymptomatic varicose veins of unspecified lower extremity: Secondary | ICD-10-CM | POA: Insufficient documentation

## 2010-09-09 NOTE — Telephone Encounter (Signed)
Spoke to patient who has symptomatic varicose veins.  Dr. Mirian Mo is proposing a procedure.  I have sent fax to his office asking for specifics before I provide authorization.

## 2010-09-09 NOTE — Progress Notes (Signed)
  Subjective:    Patient ID: Tammy Castro, female    DOB: August 31, 1950, 60 y.o.   MRN: [id number]  HPI Contacted by patient and by Dr. Jimmie Molly, vein specialist.  Patient has had symptomatic varicose veins for years.  She has failed a three month trial of compression stockings.  She is active and the leg pain prevents/interferes with activity.  I recommend treatment to maintain her active lifestyle.      Review of Systems     Objective:   Physical Exam        Assessment & Plan:

## 2011-01-13 ENCOUNTER — Telehealth: Payer: Self-pay | Admitting: Family Medicine

## 2011-01-13 NOTE — Telephone Encounter (Signed)
Wants to speak with RN about getting a letter to clear her to have her veins corrected.

## 2011-01-15 ENCOUNTER — Encounter: Payer: Self-pay | Admitting: Family Medicine

## 2011-01-15 NOTE — Telephone Encounter (Signed)
mailed

## 2011-01-15 NOTE — Telephone Encounter (Signed)
Called and discussed.  I will provide letter.

## 2011-03-13 ENCOUNTER — Ambulatory Visit: Payer: BC Managed Care – PPO

## 2011-04-21 ENCOUNTER — Other Ambulatory Visit: Payer: Self-pay | Admitting: Family Medicine

## 2011-04-21 DIAGNOSIS — Z860100 Personal history of colon polyps, unspecified: Secondary | ICD-10-CM | POA: Insufficient documentation

## 2011-04-21 DIAGNOSIS — Z1211 Encounter for screening for malignant neoplasm of colon: Secondary | ICD-10-CM

## 2011-04-21 NOTE — Assessment & Plan Note (Signed)
Patient asked to be mailed hemocult cards in advance of her annual appointment.

## 2011-05-02 ENCOUNTER — Encounter: Payer: Self-pay | Admitting: Family Medicine

## 2011-05-02 ENCOUNTER — Ambulatory Visit (INDEPENDENT_AMBULATORY_CARE_PROVIDER_SITE_OTHER): Payer: BC Managed Care – PPO | Admitting: Family Medicine

## 2011-05-02 VITALS — BP 138/82 | HR 72 | Temp 97.7°F | Ht 65.0 in | Wt 163.1 lb

## 2011-05-02 DIAGNOSIS — R413 Other amnesia: Secondary | ICD-10-CM | POA: Insufficient documentation

## 2011-05-02 DIAGNOSIS — E7800 Pure hypercholesterolemia, unspecified: Secondary | ICD-10-CM

## 2011-05-02 DIAGNOSIS — Z1211 Encounter for screening for malignant neoplasm of colon: Secondary | ICD-10-CM

## 2011-05-02 DIAGNOSIS — M75100 Unspecified rotator cuff tear or rupture of unspecified shoulder, not specified as traumatic: Secondary | ICD-10-CM | POA: Insufficient documentation

## 2011-05-02 DIAGNOSIS — M67919 Unspecified disorder of synovium and tendon, unspecified shoulder: Secondary | ICD-10-CM

## 2011-05-02 DIAGNOSIS — E039 Hypothyroidism, unspecified: Secondary | ICD-10-CM

## 2011-05-02 LAB — LIPID PANEL
Cholesterol: 201 mg/dL — ABNORMAL HIGH (ref 0–200)
HDL: 68 mg/dL (ref >39)
Triglycerides: 83 mg/dL (ref <150)

## 2011-05-02 LAB — CBC
HCT: 41.2 % (ref 36.0–46.0)
Hemoglobin: 13.7 g/dL (ref 12.0–15.0)
MCH: 30.4 pg (ref 26.0–34.0)
MCHC: 33.3 g/dL (ref 30.0–36.0)
MCV: 91.4 fL (ref 78.0–100.0)
Platelets: 318 10*3/uL (ref 150–400)
WBC: 4.1 10*3/uL (ref 4.0–10.5)

## 2011-05-02 LAB — COMPLETE METABOLIC PANEL WITH GFR
ALT: 17 U/L (ref 0–35)
AST: 21 U/L (ref 0–37)
Alkaline Phosphatase: 51 U/L (ref 39–117)
CO2: 22 mEq/L (ref 19–32)
Calcium: 9.4 mg/dL (ref 8.4–10.5)
Chloride: 107 mEq/L (ref 96–112)
Creat: 0.83 mg/dL (ref 0.50–1.10)
Total Bilirubin: 0.6 mg/dL (ref 0.3–1.2)

## 2011-05-02 LAB — VITAMIN B12: Vitamin B-12: 562 pg/mL (ref 211–911)

## 2011-05-02 NOTE — Patient Instructions (Signed)
I did lots of blood work. I will call and send letter. Good luck with your repeat mammogram.  There is good reason to hope that this is nothing. The nurse will set you up with a GI referral and with the scan of your brain. See me in  3 weeks.  You still need a pap smear. Google exercises for rotator cuff syndrome

## 2011-05-02 NOTE — Assessment & Plan Note (Signed)
Mother recently had colon cancer and died from complications.  Last colonoscopy 2006.  Will repeat early due to family history.

## 2011-05-05 ENCOUNTER — Encounter: Payer: Self-pay | Admitting: Family Medicine

## 2011-05-05 NOTE — Assessment & Plan Note (Signed)
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  Will look for reversable causes and FU in 3 weeks.  Consider trial of Aricept or SSRI.

## 2011-05-05 NOTE — Assessment & Plan Note (Signed)
Given education and exercises

## 2011-05-05 NOTE — Progress Notes (Signed)
  Subjective:    Patient ID: Tammy Castro, female    DOB: October 13, 1950, 61 y.o.   MRN: [id number]  HPI Multiple problems: We focused on three big issues.  Has Lt shoulder, upper arm pain.  No neck pain.  Worse at night.  Worse with arm/shoulder movement.  Not worse with activity.  No relation to eating.  No SOB  Mom died of complications of colonoscopy with malignant polyps removed.  Last colonoscopy 2006.  She wants early repeat due to family history.  Comes in with daughter.  Both worried about early dementia.  Runs strong in family.  They have noticed what they consider to be more than normal age related forgetfulness. 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  She is stoic but did become tearful at one point in the office.   Review of Systems     Objective:   Physical Exam Neck FROM, no masses Lt shoulder, positive empty can sign and compression test.  Normal DTRs Cardiac RRR without m or g Abd benign        Assessment & Plan:

## 2011-05-07 ENCOUNTER — Ambulatory Visit
Admission: RE | Admit: 2011-05-07 | Discharge: 2011-05-07 | Disposition: A | Discharge status: Home / Self Care | Payer: BC Managed Care – PPO | Source: Ambulatory Visit | Attending: Family Medicine | Admitting: Family Medicine

## 2011-05-07 DIAGNOSIS — R413 Other amnesia: Secondary | ICD-10-CM

## 2011-05-28 ENCOUNTER — Ambulatory Visit (INDEPENDENT_AMBULATORY_CARE_PROVIDER_SITE_OTHER): Payer: BC Managed Care – PPO | Admitting: Family Medicine

## 2011-05-28 ENCOUNTER — Encounter: Payer: Self-pay | Admitting: Internal Medicine

## 2011-05-28 ENCOUNTER — Encounter: Payer: Self-pay | Admitting: Family Medicine

## 2011-05-28 VITALS — BP 135/76 | HR 62 | Temp 97.8°F | Ht 65.0 in | Wt 164.1 lb

## 2011-05-28 DIAGNOSIS — F4321 Adjustment disorder with depressed mood: Secondary | ICD-10-CM

## 2011-05-28 NOTE — Patient Instructions (Signed)
Stay healthy even through this tough time My nurse will set you up for the colonoscopy. Mika can see the same GI doctor for his swallowing problem Let me know if I need to talk to [facility] or [name] to help them with this tough time.   Stay in touch.  I want to known how things unfold and how I can help.

## 2011-05-28 NOTE — Progress Notes (Signed)
appt made for pt for colonoscopy screening. Pt given this information prior to leaving office today. Asked pt that if she cannot keep appt to call their office 24 hours prior to appt to cancel/reschedule. Dates and times as follows:   Nature conservation officer -GI [address] 3rd floor Ph: [phone] Jul 01, 2011 @ 1000 am Tuesday (this is a Engineer, civil (consulting) visit)  Nature conservation officer -GI [address] 3rd floor Ph: [phone] Jul 15, 2011 @ 900 am Tuesday (this is for the procedure)  .Marland KitchenLoralee Pacas [place]

## 2011-05-30 DIAGNOSIS — F4321 Adjustment disorder with depressed mood: Secondary | ICD-10-CM | POA: Insufficient documentation

## 2011-05-30 NOTE — Progress Notes (Signed)
  Subjective:    Patient ID: Tammy Castro, female    DOB: [birth year], 61 y.o.   MRN: [id number]  HPI  She came think she was due for a pap smear.  She is actually up to date.  We did review other health maint and nurse will schedule colonoscopy ordered last visit.  Main issue discussed is her husband, who has longstanding metastatic cancer and suffered a recent fall with intracranial hemorrhage.  She tearfully spoke of his long (10 year) struggle and how he will likely switch to hospice care.  Their children love their father and they are resistant to such change.  She worries she may start drinking again after Aimo dies.     Review of Systems     Objective:   Physical Exam Tearful  No SI or  HI.   Does calm down nicely      Assessment & Plan:

## 2011-05-30 NOTE — Assessment & Plan Note (Signed)
Anticipitory grief.  Greater than 50% of visit spent on counseling.  Visit duration was 25 minutes

## 2011-06-27 ENCOUNTER — Encounter: Payer: Self-pay | Admitting: Internal Medicine

## 2011-06-27 ENCOUNTER — Ambulatory Visit (AMBULATORY_SURGERY_CENTER): Payer: BC Managed Care – PPO

## 2011-06-27 VITALS — Ht 65.0 in | Wt 163.6 lb

## 2011-06-27 DIAGNOSIS — Z800 Family history of malignant neoplasm of digestive organs: Secondary | ICD-10-CM

## 2011-06-27 DIAGNOSIS — Z1211 Encounter for screening for malignant neoplasm of colon: Secondary | ICD-10-CM

## 2011-06-27 MED ORDER — PEG-KCL-NACL-NASULF-NA ASC-C 100 G PO SOLR: 1.0000 | Freq: Once | ORAL | Status: AC

## 2011-07-15 ENCOUNTER — Ambulatory Visit (AMBULATORY_SURGERY_CENTER): Payer: BC Managed Care – PPO | Admitting: Internal Medicine

## 2011-07-15 ENCOUNTER — Encounter: Payer: Self-pay | Admitting: Internal Medicine

## 2011-07-15 VITALS — BP 139/79 | HR 72 | Temp 97.0°F | Resp 16 | Ht 65.0 in | Wt 163.0 lb

## 2011-07-15 DIAGNOSIS — K648 Other hemorrhoids: Secondary | ICD-10-CM

## 2011-07-15 DIAGNOSIS — Z800 Family history of malignant neoplasm of digestive organs: Secondary | ICD-10-CM

## 2011-07-15 DIAGNOSIS — Z1211 Encounter for screening for malignant neoplasm of colon: Secondary | ICD-10-CM

## 2011-07-15 DIAGNOSIS — K5730 Diverticulosis of large intestine without perforation or abscess without bleeding: Secondary | ICD-10-CM

## 2011-07-15 DIAGNOSIS — D126 Benign neoplasm of colon, unspecified: Secondary | ICD-10-CM

## 2011-07-15 MED ORDER — SODIUM CHLORIDE 0.9 % IV SOLN: 500.0000 mL | INTRAVENOUS | Status: DC

## 2011-07-15 NOTE — Patient Instructions (Addendum)

## 2011-07-15 NOTE — Op Note (Signed)
[facility] [address] [place], [place]  [address]  COLONOSCOPY PROCEDURE REPORT  PATIENT:  Tammy, Castro  MR#:  [id number] BIRTHDATE:  January 01, 1951, 60 yrs. old  GENDER:  female ENDOSCOPIST:  Iva Boop, MD, [facility] REF. BY:  Doralee Albino, M.D. PROCEDURE DATE:  07/15/2011 PROCEDURE:  Colonoscopy with snare polypectomy ASA CLASS:  Class I INDICATIONS:  Routine Risk Screening ? of family history of colon cancer MEDICATIONS:   These medications were titrated to patient response per physician's verbal order, Fentanyl 50 mcg IV, Versed 6 mg IV  DESCRIPTION OF PROCEDURE:   After the risks benefits and alternatives of the procedure were thoroughly explained, informed consent was obtained.  Digital rectal exam was performed and revealed no abnormalities.   The LB [id number]-[id number] endoscope was introduced through the anus and advanced to the anastomosis, without limitations.  The quality of the prep was excellent, using MoviPrep.  The instrument was then slowly withdrawn as the colon was fully examined. <<PROCEDUREIMAGES>>  FINDINGS:  A diminutive polyp was found in the proximal transverse colon. 5 mm polyp cold snare removal.  Mild diverticulosis was found in the left colon.  This was otherwise a normal examination of the colon. s/p right hemi-colectomy.   Retroflexed views in the rectum revealed internal hemorrhoids.    The time to anastomosis = 5:20 minutes. The scope was then withdrawn in 9:26 minutes from the anastomosis and the procedure completed. COMPLICATIONS:  None ENDOSCOPIC IMPRESSION: 1) Diminutive polyp in the proximal transverse colon - removed 2) Mild diverticulosis in the left colon 3) Internal hemorrhoids 4) Otherwise normal examination, s/p right colectomy- excellent prep  REPEAT EXAM:  In for Colonoscopy, pending biopsy results. Note: It turns out her mother had 2 colon polyps removed at 74 - so closer follow-up is not needed. Await pathology  of polyp to determine timing of next routine colonoscopy.  Iva Boop, MD, [facility]  CC:  Doralee Albino, MD and The Patient  n. eSIGNED:   Iva Boop at 07/15/2011 10:29 AM  Donell Beers, [phone]

## 2011-07-15 NOTE — Progress Notes (Signed)
Patient did not experience any of the following events: a burn prior to discharge; a fall within the facility; wrong site/side/patient/procedure/implant event; or a hospital transfer or hospital admission upon discharge from the facility. (G8907) Patient did not have preoperative order for IV antibiotic SSI prophylaxis. (G8918)  

## 2011-07-16 ENCOUNTER — Telehealth: Payer: Self-pay | Admitting: *Deleted

## 2011-07-16 NOTE — Telephone Encounter (Signed)
  Follow up Call-  Call back number 07/15/2011  Post procedure Call Back phone  # [phone]  Permission to leave phone message Yes     Left message to call if necessary.

## 2011-07-18 ENCOUNTER — Encounter: Payer: Self-pay | Admitting: Family Medicine

## 2011-07-22 ENCOUNTER — Encounter: Payer: Self-pay | Admitting: Internal Medicine

## 2011-07-22 NOTE — Progress Notes (Signed)
Quick Note:  Diminutive adenoma - repeat colonoscopy about 5 years 2018 ______

## 2011-07-24 ENCOUNTER — Encounter: Payer: Self-pay | Admitting: Family Medicine

## 2011-07-28 ENCOUNTER — Telehealth: Payer: Self-pay | Admitting: Family Medicine

## 2011-07-28 MED ORDER — ASPIRIN EC 325 MG PO TBEC: 325.0000 mg | DELAYED_RELEASE_TABLET | Freq: Every day | ORAL | Status: AC

## 2011-07-28 NOTE — Telephone Encounter (Signed)
Called and discussed.   Husband dying of cancer so she is very anxious.  Wants to know how to prevent.  Recommended one aspirin daily

## 2011-07-28 NOTE — Telephone Encounter (Signed)
Please call Tammy Castro regarding her colonoscopy visit.  She is upset about the letter she received from Dr. Marvell Fuller office.  The visit is in Epic, so she want you to review it and let her know what's going on.  She is very anxious!!!

## 2011-10-10 ENCOUNTER — Telehealth: Payer: Self-pay | Admitting: Family Medicine

## 2011-10-10 NOTE — Telephone Encounter (Signed)
Needs orders for ultrasound for her breast -they sent a letter for [place] to schedule [phone] [facility] Needs after Wed 8/28  Ps- Call Harrietta Guardian  :)

## 2011-10-15 NOTE — Telephone Encounter (Signed)
Pt called again today and needs to know something asap  Is also asking to speak with Dr Leveda Anna about something personal

## 2011-10-16 NOTE — Telephone Encounter (Signed)
Called and discussed with patient and also called imaging and gave verbal orders.

## 2011-11-10 ENCOUNTER — Encounter: Payer: Self-pay | Admitting: Family Medicine

## 2011-11-10 ENCOUNTER — Telehealth: Payer: Self-pay | Admitting: Family Medicine

## 2011-11-10 DIAGNOSIS — Z1239 Encounter for other screening for malignant neoplasm of breast: Secondary | ICD-10-CM

## 2011-11-10 NOTE — Telephone Encounter (Signed)
Please call Tammy Castro regarding results from test she had to breasts.  Want your advise about having a second opinion .

## 2011-11-11 NOTE — Telephone Encounter (Signed)
I discussed with patient and she wants a second opinion.  I had the below e mail exchange with one of the radiologists, Dr. Molli Posey.  Annette Stable,   This is not an infrequent situation.  The [facility] has a very well organized process for just this type of patient.  She should obtain her mammograms/ultrasound on CD and then schedule an appointment at the [facility] for this second opinion.  At that time, one of our radiologists will review the exam and then sit down with her and go over the study with her in person.  At that point, she is treated just like a women who has been called back for a diagnostic mammogram in that all necessary imaging and biopsy can be performed at that visit if so desired by her after consultation with the radiologist.  Pathology results from any biopsy are available the following morning and will be discussed with her by the radiologist.    She will need a referral for the appointment and the charge is billed as "[phone]- Consult of Outside Films" at $110.00. Obviously, if indicated and she decides to proceed with biopsy at that visit, there will be additional charges, but those should be covered by her plan.  For the actual second read of her initial mammogram, the out of pocket payment would depend on her plan, but would most likely be entirely her responsibility.  Hope this helps and if I can do anything to assist in getting her set up at the [facility] Center, please don't hesitate to let me know.  Cecil Cranker, M.D. Ph.D. [facility] [address] [place], [place] [address] [phone]  "Leading Radiology excellence through personalized and compassionate care."  CONFIDENTIALITY NOTICE  The information contained in this email may contain legally privileged and confidential information intended only for the use of the individual noted above. If you are not the  intended recipient or employee or agent of the entity listed above, you are hereby notified that any reading, disclosure, distribution, or copying of this email communication in any way, or the taking of any action in relation to this communication, is strictly prohibited. If you have received this email in error, please immediately notify the sender and contact our Pharmacist, hospital at [phone]. If you were not the intended recipient, please remove it from your files. Thank you for your compliance.                  I have a nice, intense patient who want a second opinion about her mammogram.  She lives in [place] and has gotten them through [place].  She had an abnormal screen in March and just had a repeat diagnostic mammo plus ultrasound done read as Birads 3.  She is uneasy about following their recommendations and waiting until March for follow up.                   The back story is that her husband died a few months ago after a long bout with prostate cancer.  I am certain she is emotionally fragile.  So, my guess is that she would need to go to [place], get her studies burned on a disk, bring the disk to [place] and pay out of pocket for the second read (since her insurance is unlikely to cover.)  Does that sound like the correct approach?     Thanks, [employer]

## 2011-11-12 ENCOUNTER — Other Ambulatory Visit: Payer: Self-pay | Admitting: Family Medicine

## 2011-11-14 ENCOUNTER — Other Ambulatory Visit: Payer: Self-pay | Admitting: Family Medicine

## 2011-11-14 ENCOUNTER — Ambulatory Visit
Admission: RE | Admit: 2011-11-14 | Discharge: 2011-11-14 | Disposition: A | Discharge status: Home / Self Care | Payer: BC Managed Care – PPO | Source: Ambulatory Visit | Attending: Family Medicine | Admitting: Family Medicine

## 2011-11-14 DIAGNOSIS — Z1239 Encounter for other screening for malignant neoplasm of breast: Secondary | ICD-10-CM

## 2011-11-14 NOTE — Telephone Encounter (Signed)
Spoke with [name].  Advised her to get her films from [place] of her mammogram and breast U/S.  Order placed for 2nd Opinion Consult at the [facility].  They will contact patient with appointment.    Tammy Castro

## 2011-12-05 ENCOUNTER — Other Ambulatory Visit: Payer: Self-pay | Admitting: Family Medicine

## 2011-12-05 ENCOUNTER — Other Ambulatory Visit: Payer: Self-pay | Admitting: Diagnostic Radiology

## 2011-12-05 ENCOUNTER — Ambulatory Visit
Admission: RE | Admit: 2011-12-05 | Discharge: 2011-12-05 | Disposition: A | Discharge status: Home / Self Care | Payer: BC Managed Care – PPO | Source: Ambulatory Visit | Attending: Family Medicine | Admitting: Family Medicine

## 2011-12-05 DIAGNOSIS — Z1239 Encounter for other screening for malignant neoplasm of breast: Secondary | ICD-10-CM

## 2012-03-03 ENCOUNTER — Encounter: Payer: Self-pay | Admitting: Family Medicine

## 2012-03-03 ENCOUNTER — Ambulatory Visit (INDEPENDENT_AMBULATORY_CARE_PROVIDER_SITE_OTHER): Payer: BC Managed Care – PPO | Admitting: Family Medicine

## 2012-03-03 VITALS — BP 145/85 | HR 77 | Temp 98.7°F | Wt 173.4 lb

## 2012-03-03 DIAGNOSIS — J019 Acute sinusitis, unspecified: Secondary | ICD-10-CM | POA: Insufficient documentation

## 2012-03-03 MED ORDER — AMOXICILLIN-POT CLAVULANATE 875-125 MG PO TABS: 1.0000 | ORAL_TABLET | Freq: Two times a day (BID) | ORAL | Status: DC
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
## 2012-03-05 ENCOUNTER — Telehealth: Payer: Self-pay | Admitting: Family Medicine
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
## 2012-03-05 NOTE — Telephone Encounter (Signed)
Pt called to say that the medicine is the right kind and is feeling better already - "thank you very much!"

## 2012-05-05 ENCOUNTER — Other Ambulatory Visit: Payer: Self-pay | Admitting: Family Medicine

## 2012-05-05 DIAGNOSIS — Z1239 Encounter for other screening for malignant neoplasm of breast: Secondary | ICD-10-CM

## 2012-05-07 ENCOUNTER — Encounter: Payer: Self-pay | Admitting: Family Medicine

## 2012-05-12 ENCOUNTER — Encounter: Payer: BC Managed Care – PPO | Admitting: Family Medicine

## 2012-05-19 ENCOUNTER — Encounter: Payer: Self-pay | Admitting: Family Medicine

## 2012-05-19 ENCOUNTER — Ambulatory Visit (INDEPENDENT_AMBULATORY_CARE_PROVIDER_SITE_OTHER): Payer: BC Managed Care – PPO | Admitting: Family Medicine

## 2012-05-19 VITALS — BP 129/78 | HR 75 | Temp 98.3°F | Ht 65.0 in | Wt 169.0 lb

## 2012-05-19 DIAGNOSIS — E039 Hypothyroidism, unspecified: Secondary | ICD-10-CM

## 2012-05-19 DIAGNOSIS — Z0000 Encounter for general adult medical examination without abnormal findings: Secondary | ICD-10-CM

## 2012-05-19 DIAGNOSIS — E7800 Pure hypercholesterolemia, unspecified: Secondary | ICD-10-CM

## 2012-05-19 LAB — COMPLETE METABOLIC PANEL WITH GFR
Albumin: 4.5 g/dL (ref 3.5–5.2)
Alkaline Phosphatase: 53 U/L (ref 39–117)
BUN: 17 mg/dL (ref 6–23)
CO2: 24 mEq/L (ref 19–32)
Calcium: 9.5 mg/dL (ref 8.4–10.5)
Chloride: 105 mEq/L (ref 96–112)
GFR, Est African American: >89 mL/min
GFR, Est Non African American: 86 mL/min
Glucose, Bld: 90 mg/dL (ref 70–99)
Potassium: 3.9 mEq/L (ref 3.5–5.3)
Sodium: 138 mEq/L (ref 135–145)
Total Protein: 7 g/dL (ref 6.0–8.3)

## 2012-05-19 LAB — TSH: TSH: 0.402 u[IU]/mL (ref 0.350–4.500)

## 2012-05-19 LAB — LIPID PANEL
Cholesterol: 216 mg/dL — ABNORMAL HIGH (ref 0–200)
Total CHOL/HDL Ratio: 2.9 Ratio

## 2012-05-19 NOTE — Patient Instructions (Signed)
You look great.  I'm delighted to hear that your kids are doing well, too. Get the bone density done. I'll call with the blood test results Stay in touch.   See me in 6 months if things are going well Don't forget your Kegel exercises.

## 2012-05-20 ENCOUNTER — Encounter: Payer: Self-pay | Admitting: Family Medicine

## 2012-05-20 NOTE — Progress Notes (Signed)
  Subjective:    Patient ID: Tammy Castro, female    DOB: 1950/11/22, 62 y.o.   MRN: [id number]  HPI Annual exam.  She is rebounding nicely from the death of her husband.   Her adult children and doing well and she has celebrated the birth of her first grandchild.  The nuclear family plans a trip to [place] to scatter Aimo's ashes.  She wanted to make sure I had reviewed the mammogram information - a recent 6 month FU which looked clean  She is nicely up to date on her HPDP.  Healthy, active lifestyle.  No physical complaints.  It is nice to see her looking so well  Needs recheck of her medical problems.     Review of Systems Neg for SOB, CP, DOE edema, bowel/bladder changes, bleeding and skin changes.    Objective:   Physical Exam HEENT nl Neck supple Lungs clear Cardiac RRR without m or g Abd benign Ext no edema Neuro grossly normal       Assessment & Plan:

## 2012-05-20 NOTE — Assessment & Plan Note (Signed)
Recheck labs 

## 2012-05-20 NOTE — Assessment & Plan Note (Signed)
Seems to be in a good place right now.

## 2012-05-20 NOTE — Assessment & Plan Note (Signed)
Check labs 

## 2012-05-21 ENCOUNTER — Telehealth: Payer: Self-pay | Admitting: Family Medicine

## 2012-05-21 NOTE — Telephone Encounter (Signed)
Patient is calling about the Bone Density screening that she was going to have done in [place] yesterday, but they need [place] to call and schedule that.  The place is called [facility] [phone].

## 2012-05-24 NOTE — Telephone Encounter (Signed)
Spoke with patient registration over at [facility] and they informed me that patient needs to call [phone] to reschedule it herself

## 2012-05-24 NOTE — Telephone Encounter (Signed)
Related message,pt agrees to schedule appt. Tammy Castro, Tammy Castro

## 2012-05-31 ENCOUNTER — Encounter: Payer: Self-pay | Admitting: Family Medicine

## 2012-05-31 NOTE — Progress Notes (Signed)
Patient ID: Tammy Castro, female   DOB: 02/05/1951, 62 y.o.   MRN: [id number] Outside bone density. Lumbar spine T=0.4 Proximal femur T=-0.5 No osteoporosis/osteopenia.  Good bone health.

## 2012-06-10 ENCOUNTER — Encounter: Payer: Self-pay | Admitting: Family Medicine

## 2012-06-10 DIAGNOSIS — Z0000 Encounter for general adult medical examination without abnormal findings: Secondary | ICD-10-CM

## 2012-06-10 NOTE — Progress Notes (Signed)
Patient ID: Tammy Castro, female   DOB: December 06, 1950, 62 y.o.   MRN: [id number] Outside bone density results from 05/28/2012 Lumbar spine T=0.4 Proximal femur T=-0.5 No osteoporosis, osteopenia or increase fracture risk.

## 2012-06-15 ENCOUNTER — Telehealth: Payer: Self-pay | Admitting: Family Medicine

## 2012-06-15 NOTE — Telephone Encounter (Signed)
Pt is asking for results of her bone density test - pls advise

## 2012-06-15 NOTE — Telephone Encounter (Signed)
Given results, which are great.

## 2012-09-06 ENCOUNTER — Ambulatory Visit (INDEPENDENT_AMBULATORY_CARE_PROVIDER_SITE_OTHER): Payer: BC Managed Care – PPO | Admitting: Family Medicine

## 2012-09-06 VITALS — BP 149/90 | HR 75 | Ht 65.0 in | Wt 171.5 lb

## 2012-09-06 DIAGNOSIS — G4700 Insomnia, unspecified: Secondary | ICD-10-CM

## 2012-09-06 DIAGNOSIS — M25569 Pain in unspecified knee: Secondary | ICD-10-CM

## 2012-09-06 DIAGNOSIS — S8990XA Unspecified injury of unspecified lower leg, initial encounter: Secondary | ICD-10-CM

## 2012-09-06 DIAGNOSIS — S8992XA Unspecified injury of left lower leg, initial encounter: Secondary | ICD-10-CM

## 2012-09-06 DIAGNOSIS — M25562 Pain in left knee: Secondary | ICD-10-CM

## 2012-09-06 MED ORDER — IBUPROFEN 600 MG PO TABS: 600.0000 mg | ORAL_TABLET | Freq: Four times a day (QID) | ORAL | Status: DC | PRN

## 2012-09-06 MED ORDER — ZOLPIDEM TARTRATE 5 MG PO TABS: 5.0000 mg | ORAL_TABLET | Freq: Every evening | ORAL | Status: DC | PRN

## 2012-09-06 NOTE — Assessment & Plan Note (Signed)
Discussed with Dr Jennette Kettle who recommended MRI of knee to rule out meniscal tear. Will also obtain complete knee xray to rule out tibial plateau fracture.  In the meantime, continue compression, ice, elevation. WIll also start on ibuprofen 600mg  q6/prn. Kidney function ok and no h/o peptic ulcer disease. PAtient also has crutches at home that she can use to avoid bearing weight on knee.

## 2012-09-06 NOTE — Assessment & Plan Note (Signed)
Difficulty sleeping from combination of knee pain as well as from recent loss of her husband. Small Rx for ambien 5mg  qhs. Advised to patient that this will not be a permanent medication, only in this acute setting. Patient expressed understanding.

## 2012-09-06 NOTE — Patient Instructions (Addendum)
Continue with the ice and the compression. Start the ibuprofen, take it with food.   We'll get the xrays and figure out a plan from there.
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
## 2012-09-07 ENCOUNTER — Ambulatory Visit (HOSPITAL_BASED_OUTPATIENT_CLINIC_OR_DEPARTMENT_OTHER)
Admission: RE | Admit: 2012-09-07 | Discharge: 2012-09-07 | Disposition: A | Discharge status: Home / Self Care | Payer: BC Managed Care – PPO | Source: Ambulatory Visit | Attending: Family Medicine | Admitting: Family Medicine

## 2012-09-07 ENCOUNTER — Encounter: Payer: Self-pay | Admitting: Family Medicine

## 2012-09-07 ENCOUNTER — Telehealth: Payer: Self-pay | Admitting: Family Medicine

## 2012-09-07 DIAGNOSIS — X500XXA Overexertion from strenuous movement or load, initial encounter: Secondary | ICD-10-CM | POA: Insufficient documentation

## 2012-09-07 DIAGNOSIS — IMO0002 Reserved for concepts with insufficient information to code with codable children: Secondary | ICD-10-CM | POA: Insufficient documentation

## 2012-09-07 DIAGNOSIS — M25562 Pain in left knee: Secondary | ICD-10-CM

## 2012-09-07 DIAGNOSIS — M76899 Other specified enthesopathies of unspecified lower limb, excluding foot: Secondary | ICD-10-CM | POA: Insufficient documentation

## 2012-09-07 DIAGNOSIS — M1710 Unilateral primary osteoarthritis, unspecified knee: Secondary | ICD-10-CM | POA: Insufficient documentation

## 2012-09-07 DIAGNOSIS — S83242A Other tear of medial meniscus, current injury, left knee, initial encounter: Secondary | ICD-10-CM

## 2012-09-07 DIAGNOSIS — M25469 Effusion, unspecified knee: Secondary | ICD-10-CM | POA: Insufficient documentation

## 2012-09-07 NOTE — Telephone Encounter (Signed)
Called patient to let her know of the results of the MRI of her knee. MRI showed posterior horn medial meniscus tear at the meniscal root with detachment and medial protrusion of the meniscus.  I explained what this means and told her that we recommend she see an orthopedic surgeon to evaluate her.  Will put in a referral for ortho at [facility].  Recommended continued icing, compression and minimal weight bearing.  Patient expressed understanding and agreed with plan.   Marena Chancy, PGY-3 Family Medicine Resident

## 2012-09-08 ENCOUNTER — Telehealth: Payer: Self-pay | Admitting: Family Medicine

## 2012-09-08 NOTE — Telephone Encounter (Signed)
Pt is requesting that the nurse call her about the recent referral's she has had. She said that there some problem. JW

## 2012-09-17 ENCOUNTER — Other Ambulatory Visit: Payer: Self-pay | Admitting: *Deleted

## 2012-09-20 MED ORDER — IBUPROFEN 600 MG PO TABS: 600.0000 mg | ORAL_TABLET | Freq: Four times a day (QID) | ORAL | Status: DC | PRN

## 2012-10-08 ENCOUNTER — Ambulatory Visit (HOSPITAL_COMMUNITY)
Admission: RE | Admit: 2012-10-08 | Discharge: 2012-10-08 | Disposition: A | Discharge status: Home / Self Care | Payer: BC Managed Care – PPO | Source: Ambulatory Visit | Attending: Orthopaedic Surgery | Admitting: Orthopaedic Surgery

## 2012-10-08 ENCOUNTER — Other Ambulatory Visit (HOSPITAL_COMMUNITY): Payer: Self-pay | Admitting: Orthopaedic Surgery

## 2012-10-08 DIAGNOSIS — M7989 Other specified soft tissue disorders: Secondary | ICD-10-CM

## 2012-10-08 DIAGNOSIS — R599 Enlarged lymph nodes, unspecified: Secondary | ICD-10-CM | POA: Insufficient documentation

## 2012-10-08 NOTE — Progress Notes (Signed)
Left lower extremity venous duplex completed.  Left:  No evidence of DVT or superficial thrombosis.  There appears to be a Baker's cyst.  Right:  Negative for DVT in the common femoral vein.  

## 2012-10-15 ENCOUNTER — Encounter: Payer: Self-pay | Admitting: Family Medicine

## 2012-10-15 NOTE — Telephone Encounter (Signed)
error 

## 2013-03-23 ENCOUNTER — Ambulatory Visit: Payer: BC Managed Care – PPO | Admitting: Family Medicine

## 2013-04-13 ENCOUNTER — Encounter: Payer: Self-pay | Admitting: Family Medicine

## 2013-04-13 ENCOUNTER — Ambulatory Visit (INDEPENDENT_AMBULATORY_CARE_PROVIDER_SITE_OTHER): Payer: BC Managed Care – PPO | Admitting: Family Medicine

## 2013-04-13 VITALS — BP 111/83 | HR 57 | Temp 98.1°F | Ht 65.0 in | Wt 166.8 lb

## 2013-04-13 DIAGNOSIS — E7800 Pure hypercholesterolemia, unspecified: Secondary | ICD-10-CM

## 2013-04-13 DIAGNOSIS — S99919A Unspecified injury of unspecified ankle, initial encounter: Secondary | ICD-10-CM

## 2013-04-13 DIAGNOSIS — Z0000 Encounter for general adult medical examination without abnormal findings: Secondary | ICD-10-CM

## 2013-04-13 DIAGNOSIS — S8992XA Unspecified injury of left lower leg, initial encounter: Secondary | ICD-10-CM

## 2013-04-13 DIAGNOSIS — Z23 Encounter for immunization: Secondary | ICD-10-CM

## 2013-04-13 DIAGNOSIS — E039 Hypothyroidism, unspecified: Secondary | ICD-10-CM

## 2013-04-13 DIAGNOSIS — S99929A Unspecified injury of unspecified foot, initial encounter: Secondary | ICD-10-CM

## 2013-04-13 DIAGNOSIS — S8990XA Unspecified injury of unspecified lower leg, initial encounter: Secondary | ICD-10-CM

## 2013-04-13 DIAGNOSIS — I8390 Asymptomatic varicose veins of unspecified lower extremity: Secondary | ICD-10-CM

## 2013-04-13 NOTE — Assessment & Plan Note (Signed)
Healthy.  Will need bloodwork after one year - future order entered.  Tetanus today.  Mammogram

## 2013-04-13 NOTE — Assessment & Plan Note (Signed)
Had surgery, problem largely resolved.

## 2013-04-13 NOTE — Patient Instructions (Signed)
It was great seeing you and seeing the picture of your beautiful grandson Congrats on the weight loss I will need to do more tests if your stools don't return to normal.  But give them a couple of months.  Get the blood work done any time after 05/19/13. Get your mammogram done any time after 05/05/13. Keep studying and making me answer your questions.

## 2013-04-13 NOTE — Assessment & Plan Note (Signed)
Had arthroscopic surg.  Recovering slowly.

## 2013-04-13 NOTE — Assessment & Plan Note (Signed)
Check lipid panel  

## 2013-04-13 NOTE — Progress Notes (Signed)
   Subjective:    Patient ID: Tammy Castro, female    DOB: [birth year], 63 y.o.   MRN: [id number]  HPI Annual exam Grief progressing OK.  Does not like living alone.  Still no alcohol Needs mammogram and tetanus. Recently had normal colonoscopy as part of work up of small calaber, loose stools.  Stools have not yet returned to normal.  All normal test results.    Review of Systems     Objective:   Physical Exam Lungs clear Cardiac RRR without m or g Abd benign       Assessment & Plan:

## 2013-04-13 NOTE — Assessment & Plan Note (Signed)
Check TSH 

## 2013-06-28 ENCOUNTER — Other Ambulatory Visit: Payer: BC Managed Care – PPO

## 2013-06-28 DIAGNOSIS — E039 Hypothyroidism, unspecified: Secondary | ICD-10-CM

## 2013-06-28 DIAGNOSIS — E7800 Pure hypercholesterolemia, unspecified: Secondary | ICD-10-CM

## 2013-06-28 LAB — COMPLETE METABOLIC PANEL WITH GFR
ALT: 23 U/L (ref 0–35)
AST: 29 U/L (ref 0–37)
Albumin: 4.4 g/dL (ref 3.5–5.2)
Alkaline Phosphatase: 54 U/L (ref 39–117)
BUN: 16 mg/dL (ref 6–23)
CALCIUM: 9.5 mg/dL (ref 8.4–10.5)
CHLORIDE: 105 meq/L (ref 96–112)
CO2: 24 mEq/L (ref 19–32)
CREATININE: 0.74 mg/dL (ref 0.50–1.10)
GFR, EST NON AFRICAN AMERICAN: 86 mL/min
GLUCOSE: 100 mg/dL — AB (ref 70–99)
Potassium: 4.5 mEq/L (ref 3.5–5.3)
Sodium: 139 mEq/L (ref 135–145)
Total Bilirubin: 0.5 mg/dL (ref 0.2–1.2)
Total Protein: 7 g/dL (ref 6.0–8.3)

## 2013-06-28 LAB — LIPID PANEL
CHOLESTEROL: 199 mg/dL (ref 0–200)
HDL: 72 mg/dL (ref >39)
LDL Cholesterol: 108 mg/dL — ABNORMAL HIGH (ref 0–99)
TRIGLYCERIDES: 97 mg/dL (ref <150)
Total CHOL/HDL Ratio: 2.8 Ratio
VLDL: 19 mg/dL (ref 0–40)

## 2013-06-28 LAB — TSH: TSH: 0.532 u[IU]/mL (ref 0.350–4.500)

## 2013-06-28 NOTE — Progress Notes (Signed)
CMP,FLP AND TSH DONE TODAY Tammy Castro 

## 2013-06-29 ENCOUNTER — Encounter: Payer: Self-pay | Admitting: Family Medicine

## 2013-07-05 ENCOUNTER — Telehealth: Payer: Self-pay | Admitting: Family Medicine

## 2013-07-05 NOTE — Telephone Encounter (Signed)
Pt called and would like her lab results. jw °

## 2013-07-07 NOTE — Telephone Encounter (Signed)
Called and given good results.

## 2013-09-07 ENCOUNTER — Encounter: Payer: Self-pay | Admitting: Family Medicine

## 2013-09-07 ENCOUNTER — Ambulatory Visit (INDEPENDENT_AMBULATORY_CARE_PROVIDER_SITE_OTHER): Payer: BC Managed Care – PPO | Admitting: Family Medicine

## 2013-09-07 VITALS — BP 132/76 | HR 68 | Temp 98.3°F | Resp 18 | Wt 170.0 lb

## 2013-09-07 DIAGNOSIS — M67919 Unspecified disorder of synovium and tendon, unspecified shoulder: Secondary | ICD-10-CM

## 2013-09-07 DIAGNOSIS — M719 Bursopathy, unspecified: Secondary | ICD-10-CM

## 2013-09-07 DIAGNOSIS — M75102 Unspecified rotator cuff tear or rupture of left shoulder, not specified as traumatic: Secondary | ICD-10-CM

## 2013-09-07 NOTE — Patient Instructions (Signed)
You have left sided rotator cuff syndrome. Google a few things Rotator cuff syndrome Subacromial bursitis  Frozen shoulder syndrome. Keep that shoulder moving to avoid frozen shoulder See me for a cortisone injection if things get bad.

## 2013-09-08 DIAGNOSIS — M75102 Unspecified rotator cuff tear or rupture of left shoulder, not specified as traumatic: Secondary | ICD-10-CM | POA: Insufficient documentation

## 2013-09-08 NOTE — Assessment & Plan Note (Signed)
Moderate/intermitant.  Doing OK with ibuprofen.  Knows now to do ROM exercises.  Not severe enough at this time to warrant either a PT referral or a steroid injection.  She knows these are future options.

## 2013-09-08 NOTE — Progress Notes (Signed)
   Subjective:    Patient ID: Tammy Castro, female    DOB: 1950/11/06, 63 y.o.   MRN: [id number]  HPI C/O several months of intermittent and sometimes severe left shoulder pain.  When the pain is at its worst, she cannot raise her arm more than 90 degrees.  She reminded me that this is a longstanding problem and I found that I had done shoulder Xrays back in 2007.  The pain is not too bad today.  Cannot sleep on left side when pain is bad.  Review of Systems     Objective:   Physical Exam + impingement testing of left shoulder.  No arm numbness or weakness.  Good ROM of shoulder.  Normal DTRs and pulses in Lt UE.        Assessment & Plan:

## 2013-09-28 ENCOUNTER — Telehealth: Payer: Self-pay | Admitting: Family Medicine

## 2013-09-28 DIAGNOSIS — M75102 Unspecified rotator cuff tear or rupture of left shoulder, not specified as traumatic: Secondary | ICD-10-CM

## 2013-09-28 NOTE — Telephone Encounter (Signed)
Pt called and would like Dr. Andria Frames to put orders in for her to have a MRI done. Please call when ready. jw

## 2013-09-29 NOTE — Assessment & Plan Note (Signed)
Patient asking for MRI because of continued symptoms.  I will get ortho referral first to insure needed and correct imaging.

## 2014-01-26 ENCOUNTER — Telehealth: Payer: Self-pay | Admitting: Family Medicine

## 2014-01-26 MED ORDER — TRAZODONE HCL 100 MG PO TABS: 100.0000 mg | ORAL_TABLET | Freq: Every day | ORAL | Status: DC

## 2014-01-26 NOTE — Telephone Encounter (Signed)
Pt called and would like to speak to Dr. Andria Frames concerning her issues sleeping. She has not been sleeping for about 2 months now and has had enough. Please call to discuss. jw

## 2014-01-26 NOTE — Telephone Encounter (Signed)
Pt needs some help for sleep--she cant sleep Please advise

## 2014-05-16 LAB — HM MAMMOGRAPHY

## 2014-06-16 ENCOUNTER — Ambulatory Visit (INDEPENDENT_AMBULATORY_CARE_PROVIDER_SITE_OTHER): Payer: BLUE CROSS/BLUE SHIELD | Admitting: Family Medicine

## 2014-06-16 ENCOUNTER — Other Ambulatory Visit (HOSPITAL_COMMUNITY)
Admission: RE | Admit: 2014-06-16 | Discharge: 2014-06-16 | Disposition: A | Discharge status: Home / Self Care | Payer: BLUE CROSS/BLUE SHIELD | Source: Ambulatory Visit | Attending: Family Medicine | Admitting: Family Medicine

## 2014-06-16 ENCOUNTER — Encounter: Payer: Self-pay | Admitting: Family Medicine

## 2014-06-16 VITALS — BP 139/82 | HR 70 | Temp 98.3°F | Ht 65.0 in | Wt 164.8 lb

## 2014-06-16 DIAGNOSIS — Z01419 Encounter for gynecological examination (general) (routine) without abnormal findings: Secondary | ICD-10-CM | POA: Diagnosis not present

## 2014-06-16 DIAGNOSIS — Z0000 Encounter for general adult medical examination without abnormal findings: Secondary | ICD-10-CM

## 2014-06-16 DIAGNOSIS — Z124 Encounter for screening for malignant neoplasm of cervix: Secondary | ICD-10-CM | POA: Diagnosis not present

## 2014-06-16 DIAGNOSIS — E7800 Pure hypercholesterolemia, unspecified: Secondary | ICD-10-CM

## 2014-06-16 DIAGNOSIS — E038 Other specified hypothyroidism: Secondary | ICD-10-CM | POA: Diagnosis not present

## 2014-06-16 DIAGNOSIS — Z1151 Encounter for screening for human papillomavirus (HPV): Secondary | ICD-10-CM | POA: Insufficient documentation

## 2014-06-16 DIAGNOSIS — R87810 Cervical high risk human papillomavirus (HPV) DNA test positive: Secondary | ICD-10-CM | POA: Insufficient documentation

## 2014-06-16 NOTE — Assessment & Plan Note (Signed)
Check labs 

## 2014-06-16 NOTE — Assessment & Plan Note (Signed)
Asymptomatic, check labs 

## 2014-06-16 NOTE — Patient Instructions (Addendum)
Yes, OK to take 81 mg aspirin every day.  We put in a future order for the following labs: CBC, TSH, ferritin, lipid profile, and Comprhensive Metabolic Panel. Come in for a lab appointment after 06/29/2014.   Dr. Andria Frames will call or send a letter with the lab results and Pap smear results.   There is no screening test for Alzheimers.  If you want to learn more about recommended screening tests, check out the [employer] web site.  They have a lot of recommendations and are the best group around.  I follow their recommendations.

## 2014-06-17 MED ORDER — ASPIRIN EC 81 MG PO TBEC: 81.0000 mg | DELAYED_RELEASE_TABLET | Freq: Every day | ORAL | Status: DC

## 2014-06-17 NOTE — Assessment & Plan Note (Signed)
Healthy OK to add ASA 81 mg

## 2014-06-17 NOTE — Progress Notes (Signed)
   Subjective:    Patient ID: Tammy Castro, female    DOB: Jun 22, 1950, 64 y.o.   MRN: [id number]  HPI Annual exam Grief improved.  Delighted that son's life is on the upswing.   Gained some wt but start at gym Insomnia improved with exercise.  Never started meds. Had thermogram and mammogram of breast this year and normal.  I have seen and am not sure why not scanned yet to chart. Concerned about FHx of dementia.  Asks about screening Asks if she needs a stress test.  No chest pain or SOM Asks why not on ASA.  Apparently I told her no at one point but I cannot find any documentation.  No contraindications seen. Right shoulder pain.  She is very into homeopathy and other complementary meds.  Apparently got a stem cell injection? In her right shoulder and is remarkably better. Due for pap.  Will be due for cholesterol screen in one month. Declines HIV screen   Review of Systems     Objective:   Physical ExamHeent nl  Lungs clear Cardiac RRR without m or g Abd benign Pelvic WNL pap done        Assessment & Plan:

## 2014-06-21 LAB — CYTOLOGY - PAP

## 2014-07-07 ENCOUNTER — Other Ambulatory Visit: Payer: BLUE CROSS/BLUE SHIELD

## 2014-07-07 DIAGNOSIS — E7800 Pure hypercholesterolemia, unspecified: Secondary | ICD-10-CM

## 2014-07-07 DIAGNOSIS — E038 Other specified hypothyroidism: Secondary | ICD-10-CM

## 2014-07-07 LAB — COMPREHENSIVE METABOLIC PANEL
ALBUMIN: 4 g/dL (ref 3.5–5.2)
ALK PHOS: 51 U/L (ref 39–117)
ALT: 18 U/L (ref 0–35)
AST: 21 U/L (ref 0–37)
BUN: 19 mg/dL (ref 6–23)
CALCIUM: 9.3 mg/dL (ref 8.4–10.5)
CO2: 26 meq/L (ref 19–32)
Chloride: 106 mEq/L (ref 96–112)
Creat: 0.68 mg/dL (ref 0.50–1.10)
GLUCOSE: 95 mg/dL (ref 70–99)
POTASSIUM: 4.4 meq/L (ref 3.5–5.3)
SODIUM: 137 meq/L (ref 135–145)
TOTAL PROTEIN: 6.8 g/dL (ref 6.0–8.3)
Total Bilirubin: 0.6 mg/dL (ref 0.2–1.2)

## 2014-07-07 LAB — CBC
HCT: 42 % (ref 36.0–46.0)
HEMOGLOBIN: 14 g/dL (ref 12.0–15.0)
MCH: 30 pg (ref 26.0–34.0)
MCHC: 33.3 g/dL (ref 30.0–36.0)
MCV: 90.1 fL (ref 78.0–100.0)
MPV: 9.2 fL (ref 8.6–12.4)
PLATELETS: 310 10*3/uL (ref 150–400)
RBC: 4.66 MIL/uL (ref 3.87–5.11)
RDW: 13.9 % (ref 11.5–15.5)
WBC: 4.6 10*3/uL (ref 4.0–10.5)

## 2014-07-07 LAB — LIPID PANEL
CHOL/HDL RATIO: 2.9 ratio
CHOLESTEROL: 193 mg/dL (ref 0–200)
HDL: 67 mg/dL (ref >=46)
LDL Cholesterol: 112 mg/dL — ABNORMAL HIGH (ref 0–99)
TRIGLYCERIDES: 70 mg/dL (ref <150)
VLDL: 14 mg/dL (ref 0–40)

## 2014-07-07 LAB — TSH: TSH: 0.545 u[IU]/mL (ref 0.350–4.500)

## 2014-07-07 LAB — FERRITIN: FERRITIN: 57 ng/mL (ref 10–291)

## 2014-07-07 NOTE — Progress Notes (Signed)
CMP,CBC,TSH,FLP AND FERRITIN DONE TODAY Tammy Castro

## 2014-07-12 ENCOUNTER — Encounter: Payer: Self-pay | Admitting: Family Medicine

## 2014-08-09 ENCOUNTER — Other Ambulatory Visit: Payer: Self-pay | Admitting: Family Medicine

## 2014-09-01 ENCOUNTER — Ambulatory Visit (INDEPENDENT_AMBULATORY_CARE_PROVIDER_SITE_OTHER): Payer: BLUE CROSS/BLUE SHIELD | Admitting: Family Medicine

## 2014-09-01 ENCOUNTER — Encounter: Payer: Self-pay | Admitting: Family Medicine

## 2014-09-01 VITALS — BP 136/82 | HR 78 | Temp 98.2°F | Wt 166.0 lb

## 2014-09-01 DIAGNOSIS — R030 Elevated blood-pressure reading, without diagnosis of hypertension: Secondary | ICD-10-CM | POA: Diagnosis not present

## 2014-09-01 NOTE — Assessment & Plan Note (Addendum)
Patient's records indicate transiently elevated BPs over the last 2 weeks. BPs her initially slightly elevated, however then normal on manuel recheck. Patient noted to have significant stressors in the home which could be contributing to her BPs. She denies a change in diet or physical activity recently. It appears she has lost a few pounds over the last year. No chest pain or LE edema concerning for acute decompensation leading to elevated BPs, additionally this would be less likely given her BPs are intermittently normal.  BPs from her records are not in a range that would concern me for stroke/end organ damage as long as they do not stay this elevated for a prolonged period of time.  Most of her DBPs are normal while the SBP are elevated which could suggest some age related change to her BPs as well.  - Reassurance was given. - Discussed taking her BP after she has been relaxing for approximately 5 minutes.  - Commended her on taking initiative and getting a life coach. - Patient decided she would lose some weight in an effort to decrease BP - Restricts salt and does not use processed foods.  - RTC precautions discussed: Headaches, vision change, scotoma, significantly elevated BPs. Patient voiced understanding  - If BPs continue to be elevated could consider 24hr BP monitoring

## 2014-09-01 NOTE — Progress Notes (Signed)
Patient ID: Tammy Castro, female   DOB: [birth year], 64 y.o.   MRN: [id number]    Subjective: CC: high BP  HPI: Patient is a 64 y.o. female  presenting to [facility] clinic today for high BP.  The patient states she intermittently takes her BPs at home and has noted transiently elevated BPs over the last 2 weeks.  She does not have a h/o HTN. Her head was buzzing and she "heard a humming in her head" a few weeks ago therefore she took her BP at home and she became worried as it was elevated to the 180s.  She went to the [facility] ED.  At that time, her BP was 176/84.  It was noted she had a lot of anxiety/stressors currently in her life. She was prescribed Unisom for sleep and it was advised that she obtain a therapist. The patient recently hired life coach.   She's still worried about her BPs given her SDA today, below is the record: Monday 137/78, HR 63 Wednesday 147/77, HR 70 Thursday 159/99, HR 85 165/98, HR 77 186/93, HR 65 Friday- 157/87, HR 73  She's had not humming or buzzing in her head since that time. No headaches, vision changes, dizziness, SOB, chest pain, or LE swelling.  She does note that she has a lot of drama in the home and has slept poorly. The patient does not wish to take any medications. She states she normally takes her BP when she feels she's getting more agitated/revved up.    Social History: Never smoker or alcohol use.    ROS: All other systems reviewed and are negative.  Past Medical History Patient Active Problem List   Diagnosis Date Noted  . Transient elevated blood pressure 09/01/2014  . Breast cancer screening 11/10/2011  . Personal history of colonic adenoma 04/21/2011  . Routine general medical examination at a health care facility 04/26/2010  . Hypothyroidism 04/16/2006  . HYPERCHOLESTEROLEMIA 04/16/2006  . DIVERTICULITIS OF COLON, NOS 04/16/2006  . 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 aspirin EC 81 MG tablet Take 1 tablet (81 mg total) by mouth daily.    . calcium-vitamin D (OSCAL WITH D 500-200) 500-200 MG-UNIT per tablet Take 1 tablet by mouth 2 (two) times daily.      Marland Kitchen ibuprofen (ADVIL,MOTRIN) 600 MG tablet take 1 tablet by mouth every 6 hours if needed for pain 60 tablet 12  . Multiple Vitamins-Minerals (COMPLETE MULTIVITAMIN/MINERAL PO) Take 1 tablet by mouth once a day.     . Omega 3-6-9 Fatty Acids (OMEGA-3 & OMEGA-6 FISH OIL) CAPS Take 1 capsule by mouth 2 (two) times daily.     No current facility-administered medications for this visit.    Objective: Office vital signs reviewed. BP 136/82 mmHg  Pulse 78  Temp(Src) 98.2 F (36.8 C) (Oral)  Wt 166 lb (75.297 kg)   Physical Examination:  General: Awake, alert, anxious appearing, intermittently tearful during the exam.  Eyes: Conjunctiva non-injected. Cardio: RRR, no m/r/g noted. No LE edema noted.  Pulm: No increased WOB.  CTAB, without wheezes, rhonchi or crackles noted.  Psych: Anxious mood, appropriate affect.  Assessment/Plan: Transient elevated blood pressure Patient's records indicate transiently elevated BPs over the last 2 weeks. BPs her initially slightly elevated, however then normal on manuel recheck. Patient noted to have significant stressors in the home which could be contributing to her BPs. She denies a change  in diet or physical activity recently. It appears she has lost a few pounds over the last year. No chest pain or LE edema concerning for acute decompensation leading to elevated BPs, additionally this would be less likely given her BPs are intermittently normal.  BPs from her records are not in a range that would concern me for stroke/end organ damage as long as they do not stay this elevated for a prolonged period of time.  Most of her DBPs are normal while the SBP are elevated which could suggest some age related change to her BPs as well.   - Discussed taking her BP after she has been relaxing for approximately 5 minutes.  - Commended her on taking initiative and getting a life coach. - Patient decided she would lose some weight in an effort to decrease BP - Restricts salt and does not use processed foods.  - RTC precautions discussed: Headaches, vision change, scotoma, significantly elevated BPs. Patient voiced understanding  - If BPs continue to be elevated could consider 24hr BP monitoring     No orders of the defined types were placed in this encounter.    No orders of the defined types were placed in this encounter.    Archie Patten PGY-2, [place]

## 2014-09-01 NOTE — Patient Instructions (Signed)
It was nice to meet you. You have a great goal of trying to lose weight. Try to decrease your stress, your life coach can help with this. If you notice you have a headache, blurred vision, or feeling like your head is swimming, relax and breath deeply for 5 minutes, then take your blood pressure. You can always call our 24hr number and speak to a doctor after hours. The number is [phone]. I'll let Dr. Andria Frames know I saw you today.

## 2014-09-04 NOTE — Progress Notes (Signed)
Reviewed and agree.

## 2014-09-12 ENCOUNTER — Encounter: Payer: Self-pay | Admitting: *Deleted

## 2015-01-04 ENCOUNTER — Encounter: Payer: Self-pay | Admitting: Family Medicine

## 2015-01-04 ENCOUNTER — Other Ambulatory Visit: Payer: Self-pay | Admitting: Family Medicine

## 2015-01-04 ENCOUNTER — Ambulatory Visit (INDEPENDENT_AMBULATORY_CARE_PROVIDER_SITE_OTHER): Payer: Medicare HMO | Admitting: Family Medicine

## 2015-01-04 VITALS — BP 148/87 | HR 71 | Temp 98.4°F | Ht 63.0 in | Wt 165.4 lb

## 2015-01-04 DIAGNOSIS — S161XXD Strain of muscle, fascia and tendon at neck level, subsequent encounter: Secondary | ICD-10-CM | POA: Diagnosis not present

## 2015-01-04 DIAGNOSIS — Z1159 Encounter for screening for other viral diseases: Secondary | ICD-10-CM

## 2015-01-04 DIAGNOSIS — S29012A Strain of muscle and tendon of back wall of thorax, initial encounter: Secondary | ICD-10-CM | POA: Insufficient documentation

## 2015-01-04 DIAGNOSIS — S161XXA Strain of muscle, fascia and tendon at neck level, initial encounter: Secondary | ICD-10-CM | POA: Insufficient documentation

## 2015-01-04 DIAGNOSIS — Z23 Encounter for immunization: Secondary | ICD-10-CM

## 2015-01-04 DIAGNOSIS — S233XXD Sprain of ligaments of thoracic spine, subsequent encounter: Secondary | ICD-10-CM | POA: Diagnosis not present

## 2015-01-04 DIAGNOSIS — X503XXA Overexertion from repetitive movements, initial encounter: Secondary | ICD-10-CM | POA: Insufficient documentation

## 2015-01-04 DIAGNOSIS — N62 Hypertrophy of breast: Secondary | ICD-10-CM

## 2015-01-04 DIAGNOSIS — Z114 Encounter for screening for human immunodeficiency virus [HIV]: Secondary | ICD-10-CM | POA: Insufficient documentation

## 2015-01-04 DIAGNOSIS — S29012D Strain of muscle and tendon of back wall of thorax, subsequent encounter: Secondary | ICD-10-CM

## 2015-01-04 DIAGNOSIS — X503XXD Overexertion from repetitive movements, subsequent encounter: Secondary | ICD-10-CM

## 2015-01-04 LAB — HEPATITIS C ANTIBODY: HCV AB: NEGATIVE

## 2015-01-04 NOTE — Patient Instructions (Signed)
I will call with blood test results and send My nurse will call with the referral. Enjoy [place]. Once you get today's pneumonia vaccine, you are done. Remember, I still recommend a flu shot.

## 2015-01-05 ENCOUNTER — Encounter: Payer: Self-pay | Admitting: Family Medicine

## 2015-01-05 LAB — HIV ANTIBODY (ROUTINE TESTING W REFLEX): HIV: NONREACTIVE

## 2015-01-05 NOTE — Progress Notes (Signed)
   Subjective:    Patient ID: Tammy Castro, female    DOB: [birth year], 64 y.o.   MRN: [id number]  HPI  Wants referral for breast reduction surgery.  The concern is not cosmetic.  She has chronic thoracic upper back and lower neck pain.  Despite her repeated best efforts, her posture suffers because she is so top heavy.  As usual with Ms. Katayama, she has done extensive investigation on her own and has concluded that she would benefit.  She has already contacted a Psychiatric nurse.  Also, from a health maint standpoint. She agrees to both HIV and Hep C screening despite no risk factors.  She says she will think about getting a flu shot later, which is her polite way of refusing.      Review of Systems     Objective:   Physical ExamShe is quite large breasted (despite a BMI of only 29)  Tender over trapezius lower neck and upper thoracic para spinous muscles.  Does have deep indentations from bra shoulder straps.        Assessment & Plan:

## 2015-01-05 NOTE — Assessment & Plan Note (Signed)
From large breast.

## 2015-01-05 NOTE — Assessment & Plan Note (Signed)
One time screen 

## 2015-01-05 NOTE — Assessment & Plan Note (Signed)
Desires breast reduction for pain relief.

## 2015-01-05 NOTE — Assessment & Plan Note (Signed)
From large breasts.

## 2015-02-21 ENCOUNTER — Encounter: Payer: Self-pay | Admitting: Family Medicine

## 2015-02-21 ENCOUNTER — Ambulatory Visit (INDEPENDENT_AMBULATORY_CARE_PROVIDER_SITE_OTHER): Payer: Medicare HMO | Admitting: Family Medicine

## 2015-02-21 VITALS — BP 122/72 | HR 81 | Temp 98.7°F | Ht 63.0 in | Wt 165.3 lb

## 2015-02-21 DIAGNOSIS — L570 Actinic keratosis: Secondary | ICD-10-CM | POA: Diagnosis not present

## 2015-02-21 DIAGNOSIS — B351 Tinea unguium: Secondary | ICD-10-CM

## 2015-02-21 DIAGNOSIS — N62 Hypertrophy of breast: Secondary | ICD-10-CM

## 2015-02-21 DIAGNOSIS — R030 Elevated blood-pressure reading, without diagnosis of hypertension: Secondary | ICD-10-CM | POA: Diagnosis not present

## 2015-02-21 MED ORDER — TERBINAFINE HCL 250 MG PO TABS: 250.0000 mg | ORAL_TABLET | Freq: Every day | ORAL | Status: DC

## 2015-02-21 NOTE — Assessment & Plan Note (Signed)
BP is great today.  Likely due to her life style changes.

## 2015-02-21 NOTE — Patient Instructions (Signed)
You have a fungus nail - the fancy medical name is Onychomycosis Google to learn more.  One pill per day for 3 months. I see a few minor things on your nose.  I am sorry we don't have the freezing stuff today.  We can take care of when you come next time. Use sunscreen.  Your skin is susceptible to sun damage and skin cancer. Have fun on your vacation and good luck with the surgery.  Let me know when you are in the hospital.

## 2015-02-21 NOTE — Assessment & Plan Note (Addendum)
Right great toe.  Treat

## 2015-02-21 NOTE — Assessment & Plan Note (Signed)
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  Will freeze next visit.

## 2015-02-21 NOTE — Assessment & Plan Note (Signed)
Excited about upcoming breast reduction surgery.

## 2015-02-21 NOTE — Progress Notes (Signed)
   Subjective:    Patient ID: Tammy Castro, female    DOB: [birth year], 65 y.o.   MRN: [id number]  HPI Several issues: 1. Approved for breast reduction surgery.  Plans for late Feb. 2. Has had a few elevated BP readings.  BP is great today. 3. Lifestyle.  Has worked hard to improve diet and exercise.  Working out every day.  She is serious about her personal health. 4. Toenail problem right great toe.  She thinks began after pedicure gone bad. 5. Friend recently had disfiguring surgery from skin cancer on nose.  She has a couple irregularities she would like me to check out.    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  Will freeze next visit BP is great Onychomycosis of right great toe         Assessment & Plan:

## 2015-02-22 ENCOUNTER — Telehealth: Payer: Self-pay | Admitting: Family Medicine

## 2015-02-22 NOTE — Telephone Encounter (Signed)
Return call to patient regarding "freezing for her nose."  Patient stated she was seen yesterday and was told there are minor changes.  Advised patient that the office did not have any liquid nitrogen in at this time.  Patient wanted PCP to give her a call discuss the changes with her nose.  She has concerns with her being gone for 4 weeks in [place], would that affect the changes.  Advised patient that she should be using sun screen on her body including her nose.  Patient insist that her PCP give her a call because she is very concerned.  Will forward to Dr. Andria Frames.  Derl Barrow, RN

## 2015-02-22 NOTE — Telephone Encounter (Signed)
Pt would like to speak to PCP or nurse regarding the "freezing for her nose". She would like to speak to PCP for a solution for her nose or she will be unable make her vacation. Sadie Reynolds, ASA

## 2015-02-28 ENCOUNTER — Telehealth: Payer: Self-pay

## 2015-02-28 NOTE — Telephone Encounter (Addendum)
Form faxed from [facility] Re[facility]ive Surgery asking for mammogram results with films. I called the pt per Dr. Mike Craze request and informed her that she will need to call the radiologist who did the mammogram and have that office send the films as we do not have them. Patient was very insistent that we do have the films. After doing some investigating, I found no films in the chart. I called pt and informed her and she will need to call the radiologist who did the mammogram to get the films sent to [facility]. Ottis Stain, CMA

## 2015-03-13 DIAGNOSIS — L989 Disorder of the skin and subcutaneous tissue, unspecified: Secondary | ICD-10-CM | POA: Diagnosis not present

## 2015-03-13 DIAGNOSIS — L723 Sebaceous cyst: Secondary | ICD-10-CM | POA: Diagnosis not present

## 2015-03-13 DIAGNOSIS — N62 Hypertrophy of breast: Secondary | ICD-10-CM | POA: Diagnosis not present

## 2015-04-04 ENCOUNTER — Other Ambulatory Visit: Payer: Self-pay | Admitting: Plastic Surgery

## 2015-04-04 DIAGNOSIS — M5489 Other dorsalgia: Secondary | ICD-10-CM | POA: Diagnosis not present

## 2015-04-04 DIAGNOSIS — N6012 Diffuse cystic mastopathy of left breast: Secondary | ICD-10-CM | POA: Diagnosis not present

## 2015-04-04 DIAGNOSIS — N62 Hypertrophy of breast: Secondary | ICD-10-CM | POA: Diagnosis not present

## 2015-04-04 DIAGNOSIS — M542 Cervicalgia: Secondary | ICD-10-CM | POA: Diagnosis not present

## 2015-04-04 DIAGNOSIS — N6011 Diffuse cystic mastopathy of right breast: Secondary | ICD-10-CM | POA: Diagnosis not present

## 2015-04-04 DIAGNOSIS — L814 Other melanin hyperpigmentation: Secondary | ICD-10-CM | POA: Diagnosis not present

## 2015-04-04 DIAGNOSIS — M549 Dorsalgia, unspecified: Secondary | ICD-10-CM | POA: Diagnosis not present

## 2015-04-04 DIAGNOSIS — M25519 Pain in unspecified shoulder: Secondary | ICD-10-CM | POA: Diagnosis not present

## 2015-04-04 DIAGNOSIS — L570 Actinic keratosis: Secondary | ICD-10-CM | POA: Diagnosis not present

## 2015-05-15 DIAGNOSIS — J40 Bronchitis, not specified as acute or chronic: Secondary | ICD-10-CM | POA: Diagnosis not present

## 2015-06-11 DIAGNOSIS — G479 Sleep disorder, unspecified: Secondary | ICD-10-CM | POA: Diagnosis not present

## 2015-06-11 DIAGNOSIS — J40 Bronchitis, not specified as acute or chronic: Secondary | ICD-10-CM | POA: Diagnosis not present

## 2015-06-11 DIAGNOSIS — J181 Lobar pneumonia, unspecified organism: Secondary | ICD-10-CM | POA: Diagnosis not present

## 2015-06-14 DIAGNOSIS — J181 Lobar pneumonia, unspecified organism: Secondary | ICD-10-CM | POA: Diagnosis not present

## 2015-06-19 DIAGNOSIS — J181 Lobar pneumonia, unspecified organism: Secondary | ICD-10-CM | POA: Diagnosis not present

## 2015-06-19 DIAGNOSIS — H5210 Myopia, unspecified eye: Secondary | ICD-10-CM | POA: Diagnosis not present

## 2015-06-19 DIAGNOSIS — H5203 Hypermetropia, bilateral: Secondary | ICD-10-CM | POA: Diagnosis not present

## 2015-06-19 DIAGNOSIS — H40013 Open angle with borderline findings, low risk, bilateral: Secondary | ICD-10-CM | POA: Diagnosis not present

## 2015-06-20 DIAGNOSIS — R05 Cough: Secondary | ICD-10-CM | POA: Diagnosis not present

## 2015-06-20 DIAGNOSIS — J181 Lobar pneumonia, unspecified organism: Secondary | ICD-10-CM | POA: Diagnosis not present

## 2015-07-25 ENCOUNTER — Encounter: Payer: Self-pay | Admitting: Family Medicine

## 2015-07-25 ENCOUNTER — Ambulatory Visit (INDEPENDENT_AMBULATORY_CARE_PROVIDER_SITE_OTHER): Payer: Commercial Managed Care - HMO | Admitting: Family Medicine

## 2015-07-25 ENCOUNTER — Telehealth: Payer: Self-pay | Admitting: *Deleted

## 2015-07-25 VITALS — BP 151/79 | HR 74 | Temp 98.1°F | Ht 65.0 in | Wt 165.2 lb

## 2015-07-25 DIAGNOSIS — E038 Other specified hypothyroidism: Secondary | ICD-10-CM

## 2015-07-25 DIAGNOSIS — R531 Weakness: Secondary | ICD-10-CM | POA: Diagnosis not present

## 2015-07-25 DIAGNOSIS — F4320 Adjustment disorder, unspecified: Secondary | ICD-10-CM

## 2015-07-25 DIAGNOSIS — Z0000 Encounter for general adult medical examination without abnormal findings: Secondary | ICD-10-CM | POA: Diagnosis not present

## 2015-07-25 DIAGNOSIS — Z13820 Encounter for screening for osteoporosis: Secondary | ICD-10-CM

## 2015-07-25 DIAGNOSIS — E559 Vitamin D deficiency, unspecified: Secondary | ICD-10-CM

## 2015-07-25 DIAGNOSIS — E7800 Pure hypercholesterolemia, unspecified: Secondary | ICD-10-CM | POA: Diagnosis not present

## 2015-07-25 LAB — CBC
HCT: 43.8 % (ref 35.0–45.0)
HEMOGLOBIN: 14.2 g/dL (ref 11.7–15.5)
MCH: 29.1 pg (ref 27.0–33.0)
MCHC: 32.4 g/dL (ref 32.0–36.0)
MCV: 89.8 fL (ref 80.0–100.0)
MPV: 9.1 fL (ref 7.5–12.5)
Platelets: 367 10*3/uL (ref 140–400)
RBC: 4.88 MIL/uL (ref 3.80–5.10)
RDW: 13.7 % (ref 11.0–15.0)
WBC: 5.1 10*3/uL (ref 3.8–10.8)

## 2015-07-25 NOTE — Telephone Encounter (Signed)
Returned pt call and she wanted to see about the xray that Dr. Andria Frames had mentioned during their visit and also to be sure that her results from this visit were mailed to her as well as the results from any test.  I contacted Dr. Andria Frames and he stated that the only thing that they were doing for this visit were the labs and the Dexa scan.  Contacted pt and informed her of below and she stated that she made a deal with her son that if he would come in for a physical that she would have an xray of her head and she said that Dr. Andria Frames could explain to her son why he is not doing this xray at his visit next week. Forwarding to PCP as an [place].  Katharina Caper, April D, [place]

## 2015-07-25 NOTE — Patient Instructions (Signed)
I will call with the lab test results. I look forward to seeing Tammy Castro next week Get your bone density done at [facility]. Take care of yourself.  Get back to exercising.

## 2015-07-25 NOTE — Telephone Encounter (Signed)
Noted and agree. 

## 2015-07-26 ENCOUNTER — Encounter: Payer: Self-pay | Admitting: Family Medicine

## 2015-07-26 DIAGNOSIS — F4320 Adjustment disorder, unspecified: Secondary | ICD-10-CM | POA: Insufficient documentation

## 2015-07-26 LAB — COMPLETE METABOLIC PANEL WITH GFR
ALBUMIN: 4.4 g/dL (ref 3.6–5.1)
ALK PHOS: 57 U/L (ref 33–130)
ALT: 17 U/L (ref 6–29)
AST: 21 U/L (ref 10–35)
BUN: 16 mg/dL (ref 7–25)
CALCIUM: 9.4 mg/dL (ref 8.6–10.4)
CO2: 22 mmol/L (ref 20–31)
Chloride: 105 mmol/L (ref 98–110)
Creat: 0.75 mg/dL (ref 0.50–0.99)
GFR, EST NON AFRICAN AMERICAN: 84 mL/min (ref >=60)
Glucose, Bld: 96 mg/dL (ref 65–99)
POTASSIUM: 4.4 mmol/L (ref 3.5–5.3)
Sodium: 139 mmol/L (ref 135–146)
Total Bilirubin: 0.5 mg/dL (ref 0.2–1.2)
Total Protein: 6.9 g/dL (ref 6.1–8.1)

## 2015-07-26 LAB — TSH: TSH: 0.46 mIU/L

## 2015-07-26 LAB — VITAMIN D 25 HYDROXY (VIT D DEFICIENCY, FRACTURES): VIT D 25 HYDROXY: 41 ng/mL (ref 30–100)

## 2015-07-26 NOTE — Assessment & Plan Note (Signed)
It has been two years.  Recheck bone density.

## 2015-07-26 NOTE — Assessment & Plan Note (Signed)
Coping OK with the family stress.  I would not classify her as depressed or anxious.  Frustrated is the apropos word.

## 2015-07-26 NOTE — Progress Notes (Signed)
   Subjective:    Patient ID: Tammy Castro, female    DOB: 1950/08/30, 65 y.o.   MRN: [id number]  HPI Annual prevention exam for a very healthy 65 yo who has had a difficult year. Feb 17 had a breast reduction, which was followed in order by: 1. Right breast wound infection requiring antibiotics. 2. Bronchitis 3. Pneumonia - "I thought I was going to die"  Hospitalization was recommended and she declined.  Now has generally recovered from those repetitive insults and is ready to resume her healthy lifestyle.  She has followed a good diet.  She has not been exercising and has just recently resumed. Other issues: 1. HX of borderline elevations of her BP.  Not on meds.  Does follow low salt diet.  Plans to resume aerobic exercising. 2. Memory concerns - see mood below.  She continues to have minor memory concerns.  It is notable that she is under significant emotional stress along with her four month ordeal of physical illness.  No red flag symptoms for dementia. 3. Mood - is good but admits to high levels of stress.  Her husband died (2 years ago?) and her son is grieving and making bad choices.  Son has an appointment with me in one week.  She spent considerable time outlining concerns.  As a caring patient, this is heartbreaking to her.  No SI or HI.  She is just highly frustrated that she cannot control the situation. 4. Screening.  She gets thermograms instead of mammograms.  She brings in a copy.  Otherwise up to date.   5. Generally weak - understandably from her four month illness.  No recent labs available to me.     Review of Systems No change in bowel, bladder, appetite. Had two skin cancers removed from her nose.  No chest pain or SOB.  She is easily fatigued since her recent illness.  No cough.     Objective:   Physical Exam HEENT normal Neck supple Lungs clear, no rales or rhonchi or wheezes Cardiac RRR without m or g Abd benign Ext no edema Neuro gait normal, motor and  sensory grossly intact.         Assessment & Plan:

## 2015-07-26 NOTE — Assessment & Plan Note (Signed)
Check tsh 

## 2015-07-26 NOTE — Assessment & Plan Note (Signed)
Not fasting.  I want to hold off on lipids until she is back exercising.

## 2015-07-26 NOTE — Assessment & Plan Note (Signed)
Healthly and exiting several back to back illnesses.  Family stress an issue.  She needs to get back to regular exercise as the most important intervention.

## 2015-07-26 NOTE — Assessment & Plan Note (Signed)
Check CBC and vitamin d.  Likely due to recent illnesses.

## 2015-07-31 DIAGNOSIS — E559 Vitamin D deficiency, unspecified: Secondary | ICD-10-CM | POA: Insufficient documentation

## 2015-07-31 NOTE — Assessment & Plan Note (Signed)
With previous low vit d and now muscle weakness, recheck level

## 2015-08-01 ENCOUNTER — Ambulatory Visit (INDEPENDENT_AMBULATORY_CARE_PROVIDER_SITE_OTHER): Payer: Commercial Managed Care - HMO

## 2015-08-01 DIAGNOSIS — Z13820 Encounter for screening for osteoporosis: Secondary | ICD-10-CM | POA: Diagnosis not present

## 2015-08-01 DIAGNOSIS — Z780 Asymptomatic menopausal state: Secondary | ICD-10-CM | POA: Diagnosis not present

## 2015-08-06 NOTE — Telephone Encounter (Signed)
Called and discussed

## 2015-08-06 NOTE — Telephone Encounter (Signed)
Patient is returning call from pcp.  Tammy Castro,CMA

## 2015-08-06 NOTE — Telephone Encounter (Signed)
Patient called back and would like to discuss the results of her bone density.  Would like to speak with provider regarding this.  Jazmin Hartsell,CMA

## 2015-08-16 DIAGNOSIS — Z85828 Personal history of other malignant neoplasm of skin: Secondary | ICD-10-CM | POA: Diagnosis not present

## 2015-08-16 DIAGNOSIS — D225 Melanocytic nevi of trunk: Secondary | ICD-10-CM | POA: Diagnosis not present

## 2015-08-16 DIAGNOSIS — D2270 Melanocytic nevi of unspecified lower limb, including hip: Secondary | ICD-10-CM | POA: Diagnosis not present

## 2015-08-16 DIAGNOSIS — L814 Other melanin hyperpigmentation: Secondary | ICD-10-CM | POA: Diagnosis not present

## 2015-08-16 DIAGNOSIS — B351 Tinea unguium: Secondary | ICD-10-CM | POA: Diagnosis not present

## 2015-08-16 DIAGNOSIS — D2260 Melanocytic nevi of unspecified upper limb, including shoulder: Secondary | ICD-10-CM | POA: Diagnosis not present

## 2015-08-16 DIAGNOSIS — L821 Other seborrheic keratosis: Secondary | ICD-10-CM | POA: Diagnosis not present

## 2015-08-16 DIAGNOSIS — L720 Epidermal cyst: Secondary | ICD-10-CM | POA: Diagnosis not present

## 2015-10-17 ENCOUNTER — Telehealth: Payer: Self-pay | Admitting: Family Medicine

## 2015-10-17 NOTE — Telephone Encounter (Signed)
Received bill from eye dr because she did not have a referral for the visit. Eye dr visit was may 2.  Please send a referral for that visit.  Amy Harber  [phone]

## 2015-10-18 DIAGNOSIS — R05 Cough: Secondary | ICD-10-CM | POA: Diagnosis not present

## 2015-10-18 DIAGNOSIS — J209 Acute bronchitis, unspecified: Secondary | ICD-10-CM | POA: Diagnosis not present

## 2015-10-18 DIAGNOSIS — R030 Elevated blood-pressure reading, without diagnosis of hypertension: Secondary | ICD-10-CM | POA: Diagnosis not present

## 2015-10-19 NOTE — Telephone Encounter (Signed)
Pt informed. Kirstyn Lean T Tanner Yeley, CMA  

## 2015-10-19 NOTE — Telephone Encounter (Signed)
Dear Dema Severin Team plese lether know Dr Andria Frames is out of town  I will route to him and he will be back in late next week THANKS! Dorcas Mcmurray

## 2015-10-24 ENCOUNTER — Encounter: Payer: Self-pay | Admitting: Family Medicine

## 2015-10-24 ENCOUNTER — Ambulatory Visit (INDEPENDENT_AMBULATORY_CARE_PROVIDER_SITE_OTHER): Payer: Commercial Managed Care - HMO | Admitting: Family Medicine

## 2015-10-24 DIAGNOSIS — Z23 Encounter for immunization: Secondary | ICD-10-CM

## 2015-10-24 DIAGNOSIS — F4320 Adjustment disorder, unspecified: Secondary | ICD-10-CM

## 2015-10-24 DIAGNOSIS — Z0000 Encounter for general adult medical examination without abnormal findings: Secondary | ICD-10-CM | POA: Diagnosis not present

## 2015-10-24 DIAGNOSIS — I10 Essential (primary) hypertension: Secondary | ICD-10-CM | POA: Insufficient documentation

## 2015-10-24 DIAGNOSIS — E7800 Pure hypercholesterolemia, unspecified: Secondary | ICD-10-CM | POA: Diagnosis not present

## 2015-10-24 DIAGNOSIS — R030 Elevated blood-pressure reading, without diagnosis of hypertension: Secondary | ICD-10-CM

## 2015-10-24 NOTE — Assessment & Plan Note (Signed)
Had eye exam last May.  Annual exam.  Now stuck with bill because no referral. Needed glaucoma screen.

## 2015-10-24 NOTE — Assessment & Plan Note (Signed)
Continue to monitor and lifestyle adjustments.

## 2015-10-24 NOTE — Patient Instructions (Addendum)
Continue to keep your eye on the blood pressure.  Keep doing the good lifestyle changes. Let me know if average is 140/90 or higher.     Try to not stress about your situation.  Be a tough [place]. I put in the eye exam referral.   I will call with the cholesterol results.

## 2015-10-24 NOTE — Assessment & Plan Note (Addendum)
Needs cholesterol Calculated 10 year ASCVD risk =5.9% based on results.  No statin needed.  Continue lifestyle modifications.

## 2015-10-24 NOTE — Assessment & Plan Note (Signed)
Stress likely contributing to high BP readings.  She seems to have turned a corner and is accepting her lack of control of her son.  Still, she grieves.

## 2015-10-24 NOTE — Telephone Encounter (Signed)
I will take care of this when I see her at [facility] today.

## 2015-10-24 NOTE — Progress Notes (Signed)
   Subjective:    Patient ID: Tammy Castro, female    DOB: Jul 06, 1950, 65 y.o.   MRN: [id number]  HPI Several issues: 1. Multiple elevated home BPs Life very stressful - see below.  She has become rigorous about diet and exercise.  Wants to avoid BP meds if possible. 2. Recent chest cold.  Seen at urgent care and prescribed augmentin.  Feeling better.   3. Needs flu shot and lipid panel. 4. Son remain a concern.  Life lacks direction.  He is abusing alcohol.  Still involved in a problematic relationship.  Mom feels stressed and helpless.  She denies vegetative symptoms or depression.  She has always just soldiered on through life's problems.   Review of Systems     Objective:   Physical Exam VS noted including good BP Lungs clear Cardiac RRR without m or g       Assessment & Plan:

## 2015-10-25 ENCOUNTER — Encounter: Payer: Self-pay | Admitting: Family Medicine

## 2015-10-25 LAB — LIPID PANEL
CHOL/HDL RATIO: 3.4 ratio (ref <=5.0)
CHOLESTEROL: 218 mg/dL — AB (ref 125–200)
HDL: 65 mg/dL (ref >=46)
LDL Cholesterol: 129 mg/dL (ref <130)
TRIGLYCERIDES: 122 mg/dL (ref <150)
VLDL: 24 mg/dL (ref <30)

## 2015-12-12 ENCOUNTER — Encounter: Payer: Self-pay | Admitting: Family Medicine

## 2015-12-12 ENCOUNTER — Ambulatory Visit (INDEPENDENT_AMBULATORY_CARE_PROVIDER_SITE_OTHER): Payer: Commercial Managed Care - HMO | Admitting: Family Medicine

## 2015-12-12 DIAGNOSIS — I10 Essential (primary) hypertension: Secondary | ICD-10-CM | POA: Diagnosis not present

## 2015-12-12 MED ORDER — HYDROCHLOROTHIAZIDE 12.5 MG PO TABS: 12.5000 mg | ORAL_TABLET | Freq: Every day | ORAL | 3 refills | Status: DC

## 2015-12-12 NOTE — Patient Instructions (Signed)
I will be happy to try you off the blood pressure medication when you have lost 20-25 pounds.   Do a series of blood pressure checks after you have been on the HCTZ for a week.

## 2015-12-13 NOTE — Progress Notes (Signed)
   Subjective:    Patient ID: Tammy Castro, female    DOB: August 18, 1950, 65 y.o.   MRN: [id number]  HPI  During recent complete physical, she was found to have an elevated BP.  She was resistant to immediately starting meds.  She returns now with multiple home readings, the majority of which remain elevated.  Denies symptoms.  Specifically, no CP, SOB, neuro sx.   Review of Systems     Objective:   Physical Exam BP elevated Cardiac RRR without m or g         Assessment & Plan:

## 2015-12-13 NOTE — Assessment & Plan Note (Signed)
Start HCTZ.  She was reassured that I would be willing to try off meds if she had sig weight loss.

## 2015-12-25 ENCOUNTER — Telehealth: Payer: Self-pay | Admitting: Family Medicine

## 2015-12-25 DIAGNOSIS — I10 Essential (primary) hypertension: Secondary | ICD-10-CM

## 2015-12-25 MED ORDER — HYDROCHLOROTHIAZIDE 12.5 MG PO TABS: 25.0000 mg | ORAL_TABLET | Freq: Every day | ORAL | 3 refills | Status: DC

## 2015-12-25 NOTE — Telephone Encounter (Signed)
Will increase HCTZ to 25 mg daily.  Call back in two weeks to report home blood pressure readings.

## 2015-12-25 NOTE — Telephone Encounter (Signed)
Pt is calling because she has high BP and was prescribed some medication. She does not feel like this has made a difference at all and would like to speak to the doctor about this and what the next steps are such as changing medication or dosage. jw

## 2016-01-04 ENCOUNTER — Telehealth: Payer: Self-pay | Admitting: Family Medicine

## 2016-01-04 DIAGNOSIS — I10 Essential (primary) hypertension: Secondary | ICD-10-CM

## 2016-01-04 NOTE — Telephone Encounter (Signed)
Pt called and would like to speak to Dr. Andria Frames about her new dosage of BP medication. She said that the doctor was expecting her call. jw

## 2016-01-07 MED ORDER — HYDROCHLOROTHIAZIDE 12.5 MG PO TABS: 12.5000 mg | ORAL_TABLET | Freq: Every day | ORAL | 3 refills | Status: DC

## 2016-01-07 NOTE — Telephone Encounter (Signed)
2nd request. ep °

## 2016-01-07 NOTE — Telephone Encounter (Signed)
Will cut back to 12.5 and monitor BP

## 2016-01-07 NOTE — Telephone Encounter (Signed)
Will send to PCP.  Derl Barrow, RN

## 2016-01-21 ENCOUNTER — Encounter: Payer: Self-pay | Admitting: *Deleted

## 2016-01-22 ENCOUNTER — Encounter: Payer: Self-pay | Admitting: *Deleted

## 2016-01-22 ENCOUNTER — Ambulatory Visit (INDEPENDENT_AMBULATORY_CARE_PROVIDER_SITE_OTHER): Payer: Commercial Managed Care - HMO | Admitting: *Deleted

## 2016-01-22 VITALS — BP 144/82 | HR 89 | Temp 98.0°F | Ht 66.0 in | Wt 164.6 lb

## 2016-01-22 DIAGNOSIS — Z0000 Encounter for general adult medical examination without abnormal findings: Secondary | ICD-10-CM

## 2016-01-22 DIAGNOSIS — Z23 Encounter for immunization: Secondary | ICD-10-CM | POA: Diagnosis not present

## 2016-01-22 NOTE — Patient Instructions (Addendum)
Fall Prevention in the Home Introduction Falls can cause injuries. They can happen to people of all ages. There are many things you can do to make your home safe and to help prevent falls. What can I do on the outside of my home?  Regularly fix the edges of walkways and driveways and fix any cracks.  Remove anything that might make you trip as you walk through a door, such as a raised step or threshold.  Trim any bushes or trees on the path to your home.  Use bright outdoor lighting.  Clear any walking paths of anything that might make someone trip, such as rocks or tools.  Regularly check to see if handrails are loose or broken. Make sure that both sides of any steps have handrails.  Any raised decks and porches should have guardrails on the edges.  Have any leaves, snow, or ice cleared regularly.  Use sand or salt on walking paths during winter.  Clean up any spills in your garage right away. This includes oil or grease spills. What can I do in the bathroom?  Use night lights.  Install grab bars by the toilet and in the tub and shower. Do not use towel bars as grab bars.  Use non-skid mats or decals in the tub or shower.  If you need to sit down in the shower, use a plastic, non-slip stool.  Keep the floor dry. Clean up any water that spills on the floor as soon as it happens.  Remove soap buildup in the tub or shower regularly.  Attach bath mats securely with double-sided non-slip rug tape.  Do not have throw rugs and other things on the floor that can make you trip. What can I do in the bedroom?  Use night lights.  Make sure that you have a light by your bed that is easy to reach.  Do not use any sheets or blankets that are too big for your bed. They should not hang down onto the floor.  Have a firm chair that has side arms. You can use this for support while you get dressed.  Do not have throw rugs and other things on the floor that can make you trip. What  can I do in the kitchen?  Clean up any spills right away.  Avoid walking on wet floors.  Keep items that you use a lot in easy-to-reach places.  If you need to reach something above you, use a strong step stool that has a grab bar.  Keep electrical cords out of the way.  Do not use floor polish or wax that makes floors slippery. If you must use wax, use non-skid floor wax.  Do not have throw rugs and other things on the floor that can make you trip. What can I do with my stairs?  Do not leave any items on the stairs.  Make sure that there are handrails on both sides of the stairs and use them. Fix handrails that are broken or loose. Make sure that handrails are as long as the stairways.  Check any carpeting to make sure that it is firmly attached to the stairs. Fix any carpet that is loose or worn.  Avoid having throw rugs at the top or bottom of the stairs. If you do have throw rugs, attach them to the floor with carpet tape.  Make sure that you have a light switch at the top of the stairs and the bottom of the stairs. If  you do not have them, ask someone to add them for you. What else can I do to help prevent falls?  Wear shoes that:  Do not have high heels.  Have rubber bottoms.  Are comfortable and fit you well.  Are closed at the toe. Do not wear sandals.  If you use a stepladder:  Make sure that it is fully opened. Do not climb a closed stepladder.  Make sure that both sides of the stepladder are locked into place.  Ask someone to hold it for you, if possible.  Clearly mark and make sure that you can see:  Any grab bars or handrails.  First and last steps.  Where the edge of each step is.  Use tools that help you move around (mobility aids) if they are needed. These include:  Canes.  Walkers.  Scooters.  Crutches.  Turn on the lights when you go into a dark area. Replace any light bulbs as soon as they burn out.  Set up your furniture so you  have a clear path. Avoid moving your furniture around.  If any of your floors are uneven, fix them.  If there are any pets around you, be aware of where they are.  Review your medicines with your doctor. Some medicines can make you feel dizzy. This can increase your chance of falling. Ask your doctor what other things that you can do to help prevent falls. This information is not intended to replace advice given to you by your health care provider. Make sure you discuss any questions you have with your health care provider. Document Released: 11/30/2008 Document Revised: 07/12/2015 Document Reviewed: 03/10/2014  2017 [employer]  Health Maintenance, Female Introduction Adopting a healthy lifestyle and getting preventive care can go a long way to promote health and wellness. Talk with your health care provider about what schedule of regular examinations is right for you. This is a good chance for you to check in with your provider about disease prevention and staying healthy. In between checkups, there are plenty of things you can do on your own. Experts have done a lot of research about which lifestyle changes and preventive measures are most likely to keep you healthy. Ask your health care provider for more information. Weight and diet Eat a healthy diet  Be sure to include plenty of vegetables, fruits, low-fat dairy products, and lean protein.  Do not eat a lot of foods high in solid fats, added sugars, or salt.  Get regular exercise. This is one of the most important things you can do for your health.  Most adults should exercise for at least 150 minutes each week. The exercise should increase your heart rate and make you sweat (moderate-intensity exercise).  Most adults should also do strengthening exercises at least twice a week. This is in addition to the moderate-intensity exercise. Maintain a healthy weight  Body mass index (BMI) is a measurement that can be used to identify  possible weight problems. It estimates body fat based on height and weight. Your health care provider can help determine your BMI and help you achieve or maintain a healthy weight.  For females 21 years of age and older:  A BMI below 18.5 is considered underweight.  A BMI of 18.5 to 24.9 is normal.  A BMI of 25 to 29.9 is considered overweight.  A BMI of 30 and above is considered obese. Watch levels of cholesterol and blood lipids  You should start having your blood tested  for lipids and cholesterol at 66 years of age, then have this test every 5 years.  You may need to have your cholesterol levels checked more often if:  Your lipid or cholesterol levels are high.  You are older than 66 years of age.  You are at high risk for heart disease. Cancer screening Lung Cancer  Lung cancer screening is recommended for adults 25-63 years old who are at high risk for lung cancer because of a history of smoking.  A yearly low-dose CT scan of the lungs is recommended for people who:  Currently smoke.  Have quit within the past 15 years.  Have at least a 30-pack-year history of smoking. A pack year is smoking an average of one pack of cigarettes a day for 1 year.  Yearly screening should continue until it has been 15 years since you quit.  Yearly screening should stop if you develop a health problem that would prevent you from having lung cancer treatment. Breast Cancer  Practice breast self-awareness. This means understanding how your breasts normally appear and feel.  It also means doing regular breast self-exams. Let your health care provider know about any changes, no matter how small.  If you are in your 20s or 30s, you should have a clinical breast exam (CBE) by a health care provider every 1-3 years as part of a regular health exam.  If you are 10 or older, have a CBE every year. Also consider having a breast X-ray (mammogram) every year.  If you have a family history of  breast cancer, talk to your health care provider about genetic screening.  If you are at high risk for breast cancer, talk to your health care provider about having an MRI and a mammogram every year.  Breast cancer gene (BRCA) assessment is recommended for women who have family members with BRCA-related cancers. BRCA-related cancers include:  Breast.  Ovarian.  Tubal.  Peritoneal cancers.  Results of the assessment will determine the need for genetic counseling and BRCA1 and BRCA2 testing. Cervical Cancer  Your health care provider may recommend that you be screened regularly for cancer of the pelvic organs (ovaries, uterus, and vagina). This screening involves a pelvic examination, including checking for microscopic changes to the surface of your cervix (Pap test). You may be encouraged to have this screening done every 3 years, beginning at age 45.  For women ages 73-65, health care providers may recommend pelvic exams and Pap testing every 3 years, or they may recommend the Pap and pelvic exam, combined with testing for human papilloma virus (HPV), every 5 years. Some types of HPV increase your risk of cervical cancer. Testing for HPV may also be done on women of any age with unclear Pap test results.  Other health care providers may not recommend any screening for nonpregnant women who are considered low risk for pelvic cancer and who do not have symptoms. Ask your health care provider if a screening pelvic exam is right for you.  If you have had past treatment for cervical cancer or a condition that could lead to cancer, you need Pap tests and screening for cancer for at least 20 years after your treatment. If Pap tests have been discontinued, your risk factors (such as having a new sexual partner) need to be reassessed to determine if screening should resume. Some women have medical problems that increase the chance of getting cervical cancer. In these cases, your health care provider may  recommend more frequent  screening and Pap tests. Colorectal Cancer  This type of cancer can be detected and often prevented.  Routine colorectal cancer screening usually begins at 66 years of age and continues through 66 years of age.  Your health care provider may recommend screening at an earlier age if you have risk factors for colon cancer.  Your health care provider may also recommend using home test kits to check for hidden blood in the stool.  A small camera at the end of a tube can be used to examine your colon directly (sigmoidoscopy or colonoscopy). This is done to check for the earliest forms of colorectal cancer.  Routine screening usually begins at age 50.  Direct examination of the colon should be repeated every 5-10 years through 66 years of age. However, you may need to be screened more often if early forms of precancerous polyps or small growths are found. Skin Cancer  Check your skin from head to toe regularly.  Tell your health care provider about any new moles or changes in moles, especially if there is a change in a mole's shape or color.  Also tell your health care provider if you have a mole that is larger than the size of a pencil eraser.  Always use sunscreen. Apply sunscreen liberally and repeatedly throughout the day.  Protect yourself by wearing long sleeves, pants, a wide-brimmed hat, and sunglasses whenever you are outside. Heart disease, diabetes, and high blood pressure  High blood pressure causes heart disease and increases the risk of stroke. High blood pressure is more likely to develop in:  People who have blood pressure in the high end of the normal range (130-139/85-89 mm Hg).  People who are overweight or obese.  People who are African American.  If you are 18-39 years of age, have your blood pressure checked every 3-5 years. If you are 40 years of age or older, have your blood pressure checked every year. You should have your blood pressure  measured twice-once when you are at a hospital or clinic, and once when you are not at a hospital or clinic. Record the average of the two measurements. To check your blood pressure when you are not at a hospital or clinic, you can use:  An automated blood pressure machine at a pharmacy.  A home blood pressure monitor.  If you are between 55 years and 79 years old, ask your health care provider if you should take aspirin to prevent strokes.  Have regular diabetes screenings. This involves taking a blood sample to check your fasting blood sugar level.  If you are at a normal weight and have a low risk for diabetes, have this test once every three years after 66 years of age.  If you are overweight and have a high risk for diabetes, consider being tested at a younger age or more often. Preventing infection Hepatitis B  If you have a higher risk for hepatitis B, you should be screened for this virus. You are considered at high risk for hepatitis B if:  You were born in a country where hepatitis B is common. Ask your health care provider which countries are considered high risk.  Your parents were born in a high-risk country, and you have not been immunized against hepatitis B (hepatitis B vaccine).  You have HIV or AIDS.  You use needles to inject street drugs.  You live with someone who has hepatitis B.  You have had sex with someone who has hepatitis B.    You get hemodialysis treatment.  You take certain medicines for conditions, including cancer, organ transplantation, and autoimmune conditions. Hepatitis C  Blood testing is recommended for:  Everyone born from [birth year] through 1966.  Anyone with known risk factors for hepatitis C. Sexually transmitted infections (STIs)  You should be screened for sexually transmitted infections (STIs) including gonorrhea and chlamydia if:  You are sexually active and are younger than 66 years of age.  You are older than 66 years of age and  your health care provider tells you that you are at risk for this type of infection.  Your sexual activity has changed since you were last screened and you are at an increased risk for chlamydia or gonorrhea. Ask your health care provider if you are at risk.  If you do not have HIV, but are at risk, it may be recommended that you take a prescription medicine daily to prevent HIV infection. This is called pre-exposure prophylaxis (PrEP). You are considered at risk if:  You are sexually active and do not regularly use condoms or know the HIV status of your partner(s).  You take drugs by injection.  You are sexually active with a partner who has HIV. Talk with your health care provider about whether you are at high risk of being infected with HIV. If you choose to begin PrEP, you should first be tested for HIV. You should then be tested every 3 months for as long as you are taking PrEP. Pregnancy  If you are premenopausal and you may become pregnant, ask your health care provider about preconception counseling.  If you may become pregnant, take 400 to 800 micrograms (mcg) of folic acid every day.  If you want to prevent pregnancy, talk to your health care provider about birth control (contraception). Osteoporosis and menopause  Osteoporosis is a disease in which the bones lose minerals and strength with aging. This can result in serious bone fractures. Your risk for osteoporosis can be identified using a bone density scan.  If you are 5 years of age or older, or if you are at risk for osteoporosis and fractures, ask your health care provider if you should be screened.  Ask your health care provider whether you should take a calcium or vitamin D supplement to lower your risk for osteoporosis.  Menopause may have certain physical symptoms and risks.  Hormone replacement therapy may reduce some of these symptoms and risks. Talk to your health care provider about whether hormone replacement  therapy is right for you. Follow these instructions at home:  Schedule regular health, dental, and eye exams.  Stay current with your immunizations.  Do not use any tobacco products including cigarettes, chewing tobacco, or electronic cigarettes.  If you are pregnant, do not drink alcohol.  If you are breastfeeding, limit how much and how often you drink alcohol.  Limit alcohol intake to no more than 1 drink per day for nonpregnant women. One drink equals 12 ounces of beer, 5 ounces of wine, or 1 ounces of hard liquor.  Do not use street drugs.  Do not share needles.  Ask your health care provider for help if you need support or information about quitting drugs.  Tell your health care provider if you often feel depressed.  Tell your health care provider if you have ever been abused or do not feel safe at home. This information is not intended to replace advice given to you by your health care provider. Make sure you discuss any  questions you have with your health care provider. Document Released: 08/19/2010 Document Revised: 07/12/2015 Document Reviewed: 11/07/2014  2017 [employer]vier DASH Eating Plan DASH stands for "Dietary Approaches to Stop Hypertension." The DASH eating plan is a healthy eating plan that has been shown to reduce high blood pressure (hypertension). Additional health benefits may include reducing the risk of type 2 diabetes mellitus, heart disease, and stroke. The DASH eating plan may also help with weight loss. What do I need to know about the DASH eating plan? For the DASH eating plan, you will follow these general guidelines:  Choose foods with less than 150 milligrams of sodium per serving (as listed on the food label).  Use salt-free seasonings or herbs instead of table salt or sea salt.  Check with your health care provider or pharmacist before using salt substitutes.  Eat lower-sodium products. These are often labeled as "low-sodium" or "no salt  added."  Eat fresh foods. Avoid eating a lot of canned foods.  Eat more vegetables, fruits, and low-fat dairy products.  Choose whole grains. Look for the word "whole" as the first word in the ingredient list.  Choose fish and skinless chicken or [place] more often than red meat. Limit fish, poultry, and meat to 6 oz (170 g) each day.  Limit sweets, desserts, sugars, and sugary drinks.  Choose heart-healthy fats.  Eat more home-cooked food and less restaurant, buffet, and fast food.  Limit fried foods.  Do not fry foods. Cook foods using methods such as baking, boiling, grilling, and broiling instead.  When eating at a restaurant, ask that your food be prepared with less salt, or no salt if possible. What foods can I eat? Seek help from a dietitian for individual calorie needs. Grains  Whole grain or whole wheat bread. Brown rice. Whole grain or whole wheat pasta. Quinoa, bulgur, and whole grain cereals. Low-sodium cereals. Corn or whole wheat flour tortillas. Whole grain cornbread. Whole grain crackers. Low-sodium crackers. Vegetables  Fresh or frozen vegetables (raw, steamed, roasted, or grilled). Low-sodium or reduced-sodium tomato and vegetable juices. Low-sodium or reduced-sodium tomato sauce and paste. Low-sodium or reduced-sodium canned vegetables. Fruits  All fresh, canned (in natural juice), or frozen fruits. Meat and Other Protein Products  Ground beef (85% or leaner), grass-fed beef, or beef trimmed of fat. Skinless chicken or [place]. Ground chicken or [place]. Pork trimmed of fat. All fish and seafood. Eggs. Dried beans, peas, or lentils. Unsalted nuts and seeds. Unsalted canned beans. Dairy  Low-fat dairy products, such as skim or 1% milk, 2% or reduced-fat cheeses, low-fat ricotta or cottage cheese, or plain low-fat yogurt. Low-sodium or reduced-sodium cheeses. Fats and Oils  Tub margarines without trans fats. Light or reduced-fat mayonnaise and salad dressings (reduced  sodium). Avocado. Safflower, olive, or canola oils. Natural peanut or almond butter. Other  Unsalted popcorn and pretzels. The items listed above may not be a complete list of recommended foods or beverages. Contact your dietitian for more options.  What foods are not recommended? Grains  White bread. White pasta. White rice. Refined cornbread. Bagels and croissants. Crackers that contain trans fat. Vegetables  Creamed or fried vegetables. Vegetables in a cheese sauce. Regular canned vegetables. Regular canned tomato sauce and paste. Regular tomato and vegetable juices. Fruits  Canned fruit in light or heavy syrup. Fruit juice. Meat and Other Protein Products  Fatty cuts of meat. Ribs, chicken wings, bacon, sausage, bologna, salami, chitterlings, fatback, hot dogs, bratwurst, and packaged luncheon meats. Salted nuts and seeds.  Canned beans with salt. Dairy  Whole or 2% milk, cream, half-and-half, and cream cheese. Whole-fat or sweetened yogurt. Full-fat cheeses or blue cheese. Nondairy creamers and whipped toppings. Processed cheese, cheese spreads, or cheese curds. Condiments  Onion and garlic salt, seasoned salt, table salt, and sea salt. Canned and packaged gravies. Worcestershire sauce. Tartar sauce. Barbecue sauce. Teriyaki sauce. Soy sauce, including reduced sodium. Steak sauce. Fish sauce. Oyster sauce. Cocktail sauce. Horseradish. Ketchup and mustard. Meat flavorings and tenderizers. Bouillon cubes. Hot sauce. Tabasco sauce. Marinades. Taco seasonings. Relishes. Fats and Oils  Butter, stick margarine, lard, shortening, ghee, and bacon fat. Coconut, palm kernel, or palm oils. Regular salad dressings. Other  Pickles and olives. Salted popcorn and pretzels. The items listed above may not be a complete list of foods and beverages to avoid. Contact your dietitian for more information.  Where can I find more information? National Heart, Lung, and Blood Institute:  travelstabloid.com This information is not intended to replace advice given to you by your health care provider. Make sure you discuss any questions you have with your health care provider. Document Released: 01/23/2011 Document Revised: 07/12/2015 Document Reviewed: 12/08/2012 Elsevier Interactive Patient Education  2017 [employer].

## 2016-01-22 NOTE — Progress Notes (Signed)
Subjective:   Tammy Castro is a 65 y.o. female who presents for an Initial Medicare Annual Wellness Visit.   Cardiac Risk Factors include: advanced age (>21mn, >>49women);hypertension     Objective:    Today's Vitals   01/22/16 1337  BP: (!) 144/82  Pulse: 89  Temp: 98 F (36.7 C)  TempSrc: Oral  SpO2: 98%  Weight: 164 lb 9.6 oz (74.7 kg)  Height: 5' 6"  (1.676 m)   Body mass index is 26.57 kg/m.   Current Medications (verified) Outpatient Encounter Prescriptions as of 01/22/2016  Medication Sig  . calcium-vitamin D (OSCAL WITH D 500-200) 500-200 MG-UNIT per tablet Take 1 tablet by mouth 2 (two) times daily.    . hydrochlorothiazide (HYDRODIURIL) 12.5 MG tablet Take 1 tablet (12.5 mg total) by mouth daily.  .Marland Kitchenibuprofen (ADVIL,MOTRIN) 600 MG tablet take 1 tablet by mouth every 6 hours if needed for pain  . Multiple Vitamins-Minerals (COMPLETE MULTIVITAMIN/MINERAL PO) Take 1 tablet by mouth once a day.   . Omega 3-6-9 Fatty Acids (OMEGA-3 & OMEGA-6 FISH OIL) CAPS Take 1 capsule by mouth 2 (two) times daily.  .Marland Kitchenaspirin EC 81 MG tablet Take 1 tablet (81 mg total) by mouth daily. (Patient not taking: Reported on 01/22/2016)   No facility-administered encounter medications on file as of 01/22/2016.     Allergies (verified) Patient has no known allergies.   History: Past Medical History:  Diagnosis Date  . Diverticulosis   . Thyroid disease   . Toxemia in pregnancy   . Varicose veins of legs    surgery scheduled for  07/01/11   Past Surgical History:  Procedure Laterality Date  . APPENDECTOMY    . COLON SURGERY    . COLONOSCOPY    . INGUINAL HERNIA REPAIR     right side  . Ruptured colon     In 1995  . THYROID SURGERY     right side  Bilateral Breast Reduction  Family History  Problem Relation Age of Onset  . Colon polyps Mother   . Cancer Mother   . Alzheimer's disease Father   . Parkinson's disease Brother   . Alcohol abuse Sister   . Alcohol  abuse Brother   . Alcohol abuse Brother    Social History   Occupational History  . Not on file.   Social History Main Topics  . Smoking status: Never Smoker  . Smokeless tobacco: Never Used  . Alcohol use No  . Drug use: No  . Sexual activity: Not Currently    Tobacco Counseling Patient has never smoked and has no plans to start.  Activities of Daily Living In your present state of health, do you have any difficulty performing the following activities: 01/22/2016  Hearing? N  Vision? Y  Difficulty concentrating or making decisions? N  Walking or climbing stairs? N  Dressing or bathing? N  Doing errands, shopping? N  Preparing Food and eating ? N  Using the Toilet? N  In the past six months, have you accidently leaked urine? Y  Do you have problems with loss of bowel control? N  Managing your Medications? N  Managing your Finances? N  Housekeeping or managing your Housekeeping? N  Some recent data might be hidden   Home Safety:  My home has a working smoke alarm:  Yes X 2           My home throw rugs have been fastened down to the floor or  removed:  Fastened down I have non-slip mats in the bathtub and shower:  Yes         All my home's stairs have railings or bannisters: Two level home with railings throughout.           My home's floors, stairs and hallways are free from clutter, wires and cords:  Yes     I wear seatbelts consistently:  Yes   Immunizations and Health Maintenance Immunization History  Administered Date(s) Administered  . Influenza Split 11/03/2011, 11/12/2013  . Influenza Whole 11/30/2007, 11/29/2008, 12/05/2009  . Influenza,inj,Quad PF,36+ Mos 10/24/2015  . Pneumococcal Conjugate-13 01/04/2015  . Pneumococcal Polysaccharide-23 02/18/2004  . Td 01/17/2002  . Tdap 04/13/2013  . Zoster 12/18/2009   Health Maintenance Due  Topic Date Due  . MAMMOGRAM  01/03/2016  . PNA vac Low Risk Adult (2 of 2 - PPSV23) 01/04/2016  Pneumovax given  today. Mammogram can now be done every 2 years per PCP  Patient Care Team: Zenia Resides, MD as PCP - General Amy Curley Spice, OD (Optometry)  Indicate any recent Medical Services you may have received from other than [facility] providers in the past year (date may be approximate).     Assessment:   This is a routine wellness examination for Tammy Castro.   Hearing/Vision screen  Hearing Screening   Method: Audiometry   125Hz  250Hz  500Hz  1000Hz  2000Hz  3000Hz  4000Hz  6000Hz  8000Hz   Right ear:   40 40 40  40    Left ear:   40 40 40  Fail      Dietary issues and exercise activities discussed: Current Exercise Habits: Structured exercise class [facility]), Type of exercise: strength training/weights;stretching;exercise ball;walking (Tai Chi), Time (Minutes): 60, Frequency (Times/Week): 3, Weekly Exercise (Minutes/Week): 180, Intensity: Moderate, Exercise limited by: orthopedic condition(s) (Right hip bone on bone)  Goals    . Reduce salt intake to 2 grams per day or less    . Weight (lb) < 148 lb (67.1 kg)          7% weight      Depression Screen PHQ 2/9 Scores 01/22/2016 12/12/2015 10/24/2015 07/25/2015 02/21/2015 01/04/2015 09/01/2014  PHQ - 2 Score 0 0 0 0 0 0 0    Fall Risk Fall Risk  01/22/2016 12/12/2015 10/24/2015 07/25/2015 02/21/2015  Falls in the past year? No No No No No  TUG Test:  Done in 8 seconds.   Cognitive Function: Mini-Cog  Passed with score 4/5  Screening Tests Health Maintenance  Topic Date Due  . MAMMOGRAM  01/03/2016  . PNA vac Low Risk Adult (2 of 2 - PPSV23) 01/04/2016  . COLONOSCOPY  04/06/2018  . TETANUS/TDAP  04/14/2023  . INFLUENZA VACCINE  Addressed  . DEXA SCAN  Completed  . ZOSTAVAX  Completed  . Hepatitis C Screening  Completed      Plan:     During the course of the visit, Tammy Castro was educated and counseled about the following appropriate screening and preventive services:   Vaccines to include Pneumoccal, Influenza, Td,  Shingrix  Cardiovascular disease screening  Colorectal cancer screening  Bone density screening  Diabetes screening  Mammography/PAP  Nutrition counseling  Patient Instructions (the written plan) were given to the patient.    Velora Heckler, RN   01/22/2016

## 2016-01-23 ENCOUNTER — Encounter: Payer: Self-pay | Admitting: *Deleted

## 2016-01-23 NOTE — Progress Notes (Signed)
Patient ID: Tammy Castro, female   DOB: 10-05-1950, 65 y.o.   MRN: [id number] I have reviewed this visit and discussed with Howell Rucks, RN, BSN, and agree with her documentation.

## 2016-02-04 ENCOUNTER — Telehealth: Payer: Self-pay | Admitting: *Deleted

## 2016-02-04 DIAGNOSIS — I10 Essential (primary) hypertension: Secondary | ICD-10-CM

## 2016-02-04 MED ORDER — HYDROCHLOROTHIAZIDE 12.5 MG PO TABS: 12.5000 mg | ORAL_TABLET | Freq: Every day | ORAL | 3 refills | Status: DC

## 2016-02-04 NOTE — Telephone Encounter (Signed)
Received fax from [facility] stating patient stated she is taking HCTZ 1 tablet in the AM and I in the PM. They are requesting new Rx with the correct directions.  Please advise.  Derl Barrow, RN

## 2016-02-04 NOTE — Telephone Encounter (Signed)
Patient asking if this can be done today as she is completely out. Informed her I would send a message to PCP.

## 2016-02-05 ENCOUNTER — Telehealth: Payer: Self-pay

## 2016-02-05 ENCOUNTER — Telehealth: Payer: Self-pay | Admitting: Family Medicine

## 2016-02-05 DIAGNOSIS — I10 Essential (primary) hypertension: Secondary | ICD-10-CM

## 2016-02-05 MED ORDER — HYDROCHLOROTHIAZIDE 12.5 MG PO TABS: 12.5000 mg | ORAL_TABLET | Freq: Two times a day (BID) | ORAL | 3 refills | Status: DC

## 2016-02-05 NOTE — Telephone Encounter (Signed)
Logan from [employer]. [phone] Received Rx for HCTZ, take 1 tab per day. Patient called and said the Rx was changed to 2 tabs per day. Please call Rolla Plate and advise which is correct. Ottis Stain, CMA

## 2016-02-05 NOTE — Telephone Encounter (Signed)
Called and discussed.  Although it is an odd dosage regimen, she does best on 12.5 bid.  She has tried 25 mg daily.  I will Rx as requested.

## 2016-02-05 NOTE — Telephone Encounter (Signed)
Called and spoke with [facility].  New electronic script sent with the correct dose.

## 2016-02-05 NOTE — Addendum Note (Signed)
Addended by: Zenia Resides on: 02/05/2016 02:56 PM   Modules accepted: Orders

## 2016-02-05 NOTE — Telephone Encounter (Signed)
Pt is very frustrated. Stated her Rx is wrong and she can not pick it up until it is correct. Pt also states she can not wait until this afternoon for this medication. Pt needs Rx to say 1 tablet in the am and 1 tablet in the pm. Please advise. Thanks! ep

## 2016-02-05 NOTE — Telephone Encounter (Signed)
See previous note. Dr. Andria Frames called [facility] and corrected the medication dose. Ottis Stain, CMA

## 2016-02-05 NOTE — Telephone Encounter (Signed)
Will forward to PCP.  Shawnee Higham L, RN  

## 2016-02-05 NOTE — Telephone Encounter (Signed)
Pt is calling because her BP medication was called in at the wrong dosage. She said she is suppose to take 1 tablet in the morning and another in the afternoon/evening. We sent this in at 1 tablet a day. Can we check to see which is correct and call pharmacy or patient. jw

## 2016-02-07 ENCOUNTER — Telehealth: Payer: Self-pay | Admitting: *Deleted

## 2016-02-07 NOTE — Telephone Encounter (Signed)
Returned patient's call requesting information on test that can be done to assess risk for alzheimer's. Patient is concerned as her father passed from alzheimer's. Discussed with PCP and patient notified that at this time no such test exists. Encouraged patient to keep brain active with activities such as puzzles, crosswords, word searches, etc. Patient states she loves puzzles and is working on one currently.  Hubbard Hartshorn, RN, BSN

## 2016-04-20 DIAGNOSIS — Z79899 Other long term (current) drug therapy: Secondary | ICD-10-CM | POA: Diagnosis not present

## 2016-04-20 DIAGNOSIS — M47811 Spondylosis without myelopathy or radiculopathy, occipito-atlanto-axial region: Secondary | ICD-10-CM | POA: Diagnosis not present

## 2016-04-20 DIAGNOSIS — S0993XA Unspecified injury of face, initial encounter: Secondary | ICD-10-CM | POA: Diagnosis not present

## 2016-04-20 DIAGNOSIS — S7001XA Contusion of right hip, initial encounter: Secondary | ICD-10-CM | POA: Diagnosis not present

## 2016-04-20 DIAGNOSIS — S0990XA Unspecified injury of head, initial encounter: Secondary | ICD-10-CM | POA: Diagnosis not present

## 2016-04-20 DIAGNOSIS — W010XXA Fall on same level from slipping, tripping and stumbling without subsequent striking against object, initial encounter: Secondary | ICD-10-CM | POA: Diagnosis not present

## 2016-04-20 DIAGNOSIS — S0083XA Contusion of other part of head, initial encounter: Secondary | ICD-10-CM | POA: Diagnosis not present

## 2016-04-20 DIAGNOSIS — R220 Localized swelling, mass and lump, head: Secondary | ICD-10-CM | POA: Diagnosis not present

## 2016-04-20 DIAGNOSIS — M50321 Other cervical disc degeneration at C4-C5 level: Secondary | ICD-10-CM | POA: Diagnosis not present

## 2016-04-20 DIAGNOSIS — S79911A Unspecified injury of right hip, initial encounter: Secondary | ICD-10-CM | POA: Diagnosis not present

## 2016-04-20 DIAGNOSIS — R51 Headache: Secondary | ICD-10-CM | POA: Diagnosis not present

## 2016-04-20 DIAGNOSIS — I10 Essential (primary) hypertension: Secondary | ICD-10-CM | POA: Diagnosis not present

## 2016-05-15 DIAGNOSIS — J0100 Acute maxillary sinusitis, unspecified: Secondary | ICD-10-CM | POA: Diagnosis not present

## 2016-06-10 DIAGNOSIS — L723 Sebaceous cyst: Secondary | ICD-10-CM | POA: Diagnosis not present

## 2016-06-10 DIAGNOSIS — L910 Hypertrophic scar: Secondary | ICD-10-CM | POA: Diagnosis not present

## 2016-06-19 ENCOUNTER — Ambulatory Visit (INDEPENDENT_AMBULATORY_CARE_PROVIDER_SITE_OTHER): Payer: Medicare HMO | Admitting: Family Medicine

## 2016-06-19 DIAGNOSIS — M75102 Unspecified rotator cuff tear or rupture of left shoulder, not specified as traumatic: Secondary | ICD-10-CM

## 2016-06-19 DIAGNOSIS — H9319 Tinnitus, unspecified ear: Secondary | ICD-10-CM

## 2016-06-19 NOTE — Patient Instructions (Signed)
Your shoulder is rotator cuff syndrome.  Remember to keep it moving and to work hard on posture. Your ear problem is tinnitus.  Google tinnitus to learn more.   Neither of the spots are worrisome.  They are benign things, not cancer.   Have fun in [place].

## 2016-06-20 ENCOUNTER — Encounter: Payer: Self-pay | Admitting: Family Medicine

## 2016-06-20 DIAGNOSIS — H9319 Tinnitus, unspecified ear: Secondary | ICD-10-CM | POA: Insufficient documentation

## 2016-06-20 NOTE — Progress Notes (Signed)
   Subjective:    Patient ID: Tammy Castro, female    DOB: 03/15/1950, 66 y.o.   MRN: [id number]  HPI Several issues - all seem to be fairly minor. 1. [place] a second time.  Both times were clearly trips.  Not lightheaded, dizzy, or vertiginous.  No palpitations.  No sig injury with this second fall. 2. Hissing sound in head - heard equally in both ears.  No hearing loss. 3. Has been seen by derm - has a spot on her left breast and lump on back.  Derm plans to see back and remove.  She wants me to check. 4. Intermitant left shoulder pain which at times limits her ability to raise her arm over her head.  Previous dx of rotator cuff. 5. Social, she is happy.  Both children are doing well.  She is seeing another man after the death of her husband.  Has a trip to [place] planned.  Life is good. 6. Brings in low risk thermogram results copy.  She prefers thermography over mammograms.   Review of Systems     Objective:   Physical Exam VS and wt noted TMs normal  Hearing grossly normal. Neck, no masses, nodes or bruit Lungs clear. Cardiac RRR without m or g Skin. Sebaceous cyst on back 1-2 cm.  Left breast lesion is redness at one of the incision edges where the nipple was reattached. Left shoulder, crepitus with compression.  Good ROM.       Assessment & Plan:

## 2016-06-20 NOTE — Assessment & Plan Note (Signed)
Educated.  No work up unless lateralizes with associated hearing loss.

## 2016-06-20 NOTE — Assessment & Plan Note (Signed)
Educated about chronic problem.  Discussed posture and exercises.  Also possible future steroid injection.

## 2016-06-30 DIAGNOSIS — L720 Epidermal cyst: Secondary | ICD-10-CM | POA: Diagnosis not present

## 2016-06-30 DIAGNOSIS — L723 Sebaceous cyst: Secondary | ICD-10-CM | POA: Diagnosis not present

## 2016-08-26 ENCOUNTER — Encounter: Payer: Self-pay | Admitting: Internal Medicine

## 2016-09-04 DIAGNOSIS — L723 Sebaceous cyst: Secondary | ICD-10-CM | POA: Diagnosis not present

## 2016-09-15 DIAGNOSIS — L723 Sebaceous cyst: Secondary | ICD-10-CM | POA: Diagnosis not present

## 2016-11-05 ENCOUNTER — Telehealth: Payer: Self-pay | Admitting: *Deleted

## 2016-11-05 DIAGNOSIS — Z1211 Encounter for screening for malignant neoplasm of colon: Secondary | ICD-10-CM

## 2016-11-05 NOTE — Telephone Encounter (Signed)
Patient called stating she would like to have a colonoscopy done. Please give her a call. Number on file is correct.  Derl Barrow, RN

## 2016-11-06 ENCOUNTER — Telehealth: Payer: Self-pay

## 2016-11-06 DIAGNOSIS — Z1211 Encounter for screening for malignant neoplasm of colon: Secondary | ICD-10-CM | POA: Insufficient documentation

## 2016-11-06 NOTE — Telephone Encounter (Signed)
Patient wants cologuard testing, not colonoscopy.

## 2016-11-06 NOTE — Telephone Encounter (Signed)
Spoke to [employer]. Herbie Baltimore suggested pt call insurance to verify coverage before [place] ordering cologuard. Pt can come by anytime and sign the request form any time she is able to come by. Pt informed. Ottis Stain, CMA

## 2016-11-06 NOTE — Telephone Encounter (Signed)
Error

## 2016-11-06 NOTE — Addendum Note (Signed)
Addended by: Zenia Resides on: 11/06/2016 03:44 PM   Modules accepted: Orders

## 2016-11-06 NOTE — Telephone Encounter (Deleted)
Error

## 2016-11-12 NOTE — Telephone Encounter (Signed)
Patient called stating she called her insurance and they told her to have provider's office call because she did not have all the information needed. Patient is requesting that [facility] call her insurance for the cologuard. Please give patient a call when done. Number on file is correct.  Derl Barrow, RN

## 2016-11-13 NOTE — Telephone Encounter (Signed)
Contacted pt who supplied me with 2 numbers to contact to try and get wahtever questions her insurance was needing in reference to the cologuard and she gave me 2 numbers to try. [phone], and [phone].  Contacted them and was transferred but no one answered.  Will call again tomorrow to see about this. Katharina Caper, April D, [place]

## 2016-11-14 NOTE — Telephone Encounter (Signed)
Contacted pt and informed her that our referral coordinator checked and that the cologuard should be convered by pt [employer]. Pt stated that someone else had also told her that they thought it would be covered. Pt stated that she would go ahead and order it then. Katharina Caper, Aunesty Tyson D, [place]

## 2016-12-04 ENCOUNTER — Telehealth: Payer: Self-pay | Admitting: Family Medicine

## 2016-12-04 NOTE — Telephone Encounter (Signed)
Pt wants to know who orders colonguard.  Please call pt and let her know (she says we never call her back)

## 2016-12-09 NOTE — Telephone Encounter (Signed)
Contacted pt and informed her that Herbie Baltimore in the lab stated that insurance may not cover this if there is any history of polyps, colon cancer whether it be for self or family history and she said that she has both of those either herself or family.  She wanted to let Dr. Andria Frames know that if he thought it was still a good idea then she would pay for the test. She said it just takes so much time to do the colonoscopy and she is just trying to save time. Routing to PCP to be advised.  Katharina Caper, April D, [place]

## 2016-12-10 NOTE — Telephone Encounter (Signed)
Spoke on phone and reviewed records.  She had last colonoscopy 03/2013 with 5 year follow up due to polyps.  She is not due again until 03/2018 and needs no other testing in the interim.  Patient understands.

## 2016-12-10 NOTE — Telephone Encounter (Signed)
Error, previous comment was entered into the wrong chart.

## 2016-12-10 NOTE — Telephone Encounter (Signed)
Tried to stop by.  Patient had left the building.  I will try again.

## 2016-12-30 DIAGNOSIS — H2513 Age-related nuclear cataract, bilateral: Secondary | ICD-10-CM | POA: Diagnosis not present

## 2016-12-30 DIAGNOSIS — H5203 Hypermetropia, bilateral: Secondary | ICD-10-CM | POA: Diagnosis not present

## 2016-12-30 DIAGNOSIS — H40003 Preglaucoma, unspecified, bilateral: Secondary | ICD-10-CM | POA: Diagnosis not present

## 2016-12-30 DIAGNOSIS — H40013 Open angle with borderline findings, low risk, bilateral: Secondary | ICD-10-CM | POA: Diagnosis not present

## 2016-12-30 DIAGNOSIS — H524 Presbyopia: Secondary | ICD-10-CM | POA: Diagnosis not present

## 2016-12-30 DIAGNOSIS — H5213 Myopia, bilateral: Secondary | ICD-10-CM | POA: Diagnosis not present

## 2016-12-30 DIAGNOSIS — H43813 Vitreous degeneration, bilateral: Secondary | ICD-10-CM | POA: Diagnosis not present

## 2016-12-30 DIAGNOSIS — H52222 Regular astigmatism, left eye: Secondary | ICD-10-CM | POA: Diagnosis not present

## 2016-12-30 DIAGNOSIS — H04123 Dry eye syndrome of bilateral lacrimal glands: Secondary | ICD-10-CM | POA: Diagnosis not present

## 2017-02-13 DIAGNOSIS — R05 Cough: Secondary | ICD-10-CM | POA: Diagnosis not present

## 2017-03-22 ENCOUNTER — Other Ambulatory Visit: Payer: Self-pay | Admitting: Family Medicine

## 2017-03-22 DIAGNOSIS — I10 Essential (primary) hypertension: Secondary | ICD-10-CM

## 2017-03-26 ENCOUNTER — Ambulatory Visit
Admission: RE | Admit: 2017-03-26 | Discharge: 2017-03-26 | Disposition: A | Discharge status: Home / Self Care | Payer: Medicare HMO | Source: Ambulatory Visit | Attending: Family Medicine | Admitting: Family Medicine

## 2017-03-26 ENCOUNTER — Encounter: Payer: Self-pay | Admitting: Family Medicine

## 2017-03-26 ENCOUNTER — Ambulatory Visit (HOSPITAL_COMMUNITY)
Admission: RE | Admit: 2017-03-26 | Discharge: 2017-03-26 | Disposition: A | Discharge status: Home / Self Care | Payer: Medicare HMO | Source: Ambulatory Visit | Attending: Family Medicine | Admitting: Family Medicine

## 2017-03-26 ENCOUNTER — Ambulatory Visit (INDEPENDENT_AMBULATORY_CARE_PROVIDER_SITE_OTHER): Payer: Medicare HMO | Admitting: Family Medicine

## 2017-03-26 ENCOUNTER — Other Ambulatory Visit: Payer: Self-pay

## 2017-03-26 VITALS — BP 122/82 | HR 77 | Temp 98.3°F | Ht 66.0 in | Wt 169.6 lb

## 2017-03-26 DIAGNOSIS — R0600 Dyspnea, unspecified: Secondary | ICD-10-CM | POA: Insufficient documentation

## 2017-03-26 DIAGNOSIS — E038 Other specified hypothyroidism: Secondary | ICD-10-CM | POA: Diagnosis not present

## 2017-03-26 DIAGNOSIS — I10 Essential (primary) hypertension: Secondary | ICD-10-CM

## 2017-03-26 DIAGNOSIS — R002 Palpitations: Secondary | ICD-10-CM | POA: Insufficient documentation

## 2017-03-26 DIAGNOSIS — R9431 Abnormal electrocardiogram [ECG] [EKG]: Secondary | ICD-10-CM | POA: Diagnosis not present

## 2017-03-26 DIAGNOSIS — R05 Cough: Secondary | ICD-10-CM | POA: Diagnosis not present

## 2017-03-26 DIAGNOSIS — E7800 Pure hypercholesterolemia, unspecified: Secondary | ICD-10-CM

## 2017-03-26 NOTE — Progress Notes (Signed)
EK 

## 2017-03-26 NOTE — Assessment & Plan Note (Addendum)
Prolonged illness greater than one month and cough.  Suspect post viral cough.  Normal CXR.  Recheck in 2 months.

## 2017-03-26 NOTE — Patient Instructions (Addendum)
I am sorry about the stresses. I will call with the test results. If the rapid heart beat comes back, you will need to see the cardiologist. See me in one month

## 2017-03-27 LAB — LIPID PANEL
Chol/HDL Ratio: 3.2 ratio (ref 0.0–4.4)
Cholesterol, Total: 237 mg/dL — ABNORMAL HIGH (ref 100–199)
HDL: 73 mg/dL (ref >39)
LDL CALC: 143 mg/dL — AB (ref 0–99)
Triglycerides: 107 mg/dL (ref 0–149)
VLDL CHOLESTEROL CAL: 21 mg/dL (ref 5–40)

## 2017-03-27 LAB — CBC
HEMATOCRIT: 42.2 % (ref 34.0–46.6)
Hemoglobin: 14 g/dL (ref 11.1–15.9)
MCH: 29.9 pg (ref 26.6–33.0)
MCHC: 33.2 g/dL (ref 31.5–35.7)
MCV: 90 fL (ref 79–97)
Platelets: 335 10*3/uL (ref 150–379)
RBC: 4.68 x10E6/uL (ref 3.77–5.28)
RDW: 13.8 % (ref 12.3–15.4)
WBC: 4.2 10*3/uL (ref 3.4–10.8)

## 2017-03-27 LAB — CMP14+EGFR
ALBUMIN: 4.6 g/dL (ref 3.6–4.8)
ALK PHOS: 71 IU/L (ref 39–117)
ALT: 33 IU/L — ABNORMAL HIGH (ref 0–32)
AST: 31 IU/L (ref 0–40)
Albumin/Globulin Ratio: 1.8 (ref 1.2–2.2)
BUN/Creatinine Ratio: 25 (ref 12–28)
BUN: 17 mg/dL (ref 8–27)
Bilirubin Total: 0.5 mg/dL (ref 0.0–1.2)
CO2: 24 mmol/L (ref 20–29)
CREATININE: 0.67 mg/dL (ref 0.57–1.00)
Calcium: 9.7 mg/dL (ref 8.7–10.3)
Chloride: 101 mmol/L (ref 96–106)
GFR calc Af Amer: 105 mL/min/{1.73_m2} (ref >59)
GFR calc non Af Amer: 91 mL/min/{1.73_m2} (ref >59)
GLUCOSE: 99 mg/dL (ref 65–99)
Globulin, Total: 2.5 g/dL (ref 1.5–4.5)
Potassium: 4.3 mmol/L (ref 3.5–5.2)
Sodium: 139 mmol/L (ref 134–144)
Total Protein: 7.1 g/dL (ref 6.0–8.5)

## 2017-03-27 LAB — TSH: TSH: 0.52 u[IU]/mL (ref 0.450–4.500)

## 2017-03-30 ENCOUNTER — Telehealth: Payer: Self-pay

## 2017-03-30 ENCOUNTER — Encounter: Payer: Self-pay | Admitting: Family Medicine

## 2017-03-30 DIAGNOSIS — R002 Palpitations: Secondary | ICD-10-CM | POA: Insufficient documentation

## 2017-03-30 NOTE — Telephone Encounter (Signed)
Patient left message on nurse line that she would like xray and lab result copies mailed to her. Danley Danker, RN [facility] RN)

## 2017-03-30 NOTE — Progress Notes (Signed)
   Subjective:    Patient ID: Tammy Castro, female    DOB: [birth year], 67 y.o.   MRN: [id number]  HPI Multiple issues: 1. Cough x 2 months.  Began as an acute illness in early Dec.  Seen by urgent care x 2.  First Rxed with tamiflu.  Then with Z pac.  Perhaps it is a two phased illness.  Now not sick, only cough.  No fever.  Feels like she cannot take a deep breath.  Never smoker. 2. Two episodes of palpitations.  Both with EtOH intake.  She had stopped alcohol, worried about abuse.  Backslid - family stress - see problem next.  Now committed to no alcohol.  No episodes when not drinking. 3. CAD risk start - only risk factor is high LDL.  Has a high HDL which is protective.   4. Family stress.  Both kids are struggling with alcohol and difficult relationships.  Both are superimposed on the death of Mr. Harrietta Guardian several years ago. 5. HBP tolerating meds without difficulty. 6. Due for cholesterol check.   7 due for thyroid check.   Review of Systems     Objective:   Physical Exam  Gen WNWD female in NAD No JVD Lungs clear Cardiac RRR without m or g Abd benign Ext trace edema.       Assessment & Plan:

## 2017-03-30 NOTE — Telephone Encounter (Signed)
Patient left message on nurse line that she just missed a call from Dr. Andria Frames. No message seen. Danley Danker, RN [facility] RN)

## 2017-03-30 NOTE — Assessment & Plan Note (Signed)
Stable on current meds 

## 2017-03-30 NOTE — Assessment & Plan Note (Signed)
LDL elevated and HDL protective.  She will work on diet and exercise and we will remeasure in 2 months.

## 2017-03-30 NOTE — Telephone Encounter (Signed)
I called back and gave lab and x ray results.

## 2017-03-30 NOTE — Assessment & Plan Note (Signed)
TSH normal no change

## 2017-03-30 NOTE — Assessment & Plan Note (Signed)
Normal EKG.  Two episodes - both related to etoh intake.  Consider holter if recurs off alcohol

## 2017-03-31 NOTE — Telephone Encounter (Signed)
Called and discussed again.  Copies mailed.

## 2017-06-20 ENCOUNTER — Other Ambulatory Visit: Payer: Self-pay | Admitting: Family Medicine

## 2017-06-20 DIAGNOSIS — Z1239 Encounter for other screening for malignant neoplasm of breast: Secondary | ICD-10-CM

## 2017-06-20 DIAGNOSIS — I10 Essential (primary) hypertension: Secondary | ICD-10-CM

## 2017-07-08 ENCOUNTER — Ambulatory Visit (INDEPENDENT_AMBULATORY_CARE_PROVIDER_SITE_OTHER): Payer: Medicare HMO

## 2017-07-08 DIAGNOSIS — Z1231 Encounter for screening mammogram for malignant neoplasm of breast: Secondary | ICD-10-CM

## 2017-07-08 DIAGNOSIS — Z1239 Encounter for other screening for malignant neoplasm of breast: Secondary | ICD-10-CM

## 2017-07-28 DIAGNOSIS — S4991XA Unspecified injury of right shoulder and upper arm, initial encounter: Secondary | ICD-10-CM | POA: Diagnosis not present

## 2017-07-28 DIAGNOSIS — S42291A Other displaced fracture of upper end of right humerus, initial encounter for closed fracture: Secondary | ICD-10-CM | POA: Diagnosis not present

## 2017-07-29 DIAGNOSIS — M1611 Unilateral primary osteoarthritis, right hip: Secondary | ICD-10-CM | POA: Diagnosis not present

## 2017-07-29 DIAGNOSIS — S40011A Contusion of right shoulder, initial encounter: Secondary | ICD-10-CM | POA: Diagnosis not present

## 2017-07-29 DIAGNOSIS — M19019 Primary osteoarthritis, unspecified shoulder: Secondary | ICD-10-CM | POA: Diagnosis not present

## 2017-07-31 DIAGNOSIS — R52 Pain, unspecified: Secondary | ICD-10-CM | POA: Diagnosis not present

## 2017-07-31 DIAGNOSIS — M1611 Unilateral primary osteoarthritis, right hip: Secondary | ICD-10-CM | POA: Diagnosis not present

## 2017-08-05 ENCOUNTER — Ambulatory Visit (INDEPENDENT_AMBULATORY_CARE_PROVIDER_SITE_OTHER): Payer: Medicare HMO

## 2017-08-05 ENCOUNTER — Ambulatory Visit (INDEPENDENT_AMBULATORY_CARE_PROVIDER_SITE_OTHER): Payer: Medicare HMO | Admitting: Orthopaedic Surgery

## 2017-08-05 ENCOUNTER — Encounter (INDEPENDENT_AMBULATORY_CARE_PROVIDER_SITE_OTHER): Payer: Self-pay | Admitting: Orthopaedic Surgery

## 2017-08-05 DIAGNOSIS — M1611 Unilateral primary osteoarthritis, right hip: Secondary | ICD-10-CM

## 2017-08-05 DIAGNOSIS — M25551 Pain in right hip: Secondary | ICD-10-CM | POA: Diagnosis not present

## 2017-08-05 NOTE — Progress Notes (Signed)
Office Visit Note   Patient: Tammy Castro           Date of Birth: 1950/04/20           MRN: [id number] Visit Date: 08/05/2017              Requested by: Zenia Resides, MD [address] [place], [place] [address] PCP: Zenia Resides, MD   Assessment & Plan: Visit Diagnoses:  1. Pain in right hip   2. Unilateral primary osteoarthritis, right hip     Plan: Given the severity of her pain on clinical exam of her right hip and given the severe arthritis on her x-ray findings at this point we are recommending a total hip replacement through a direct anterior approach.  We had a long and thorough discussion about this.  We showed her hip model and went over x-rays explained in detail with the surgery involves including an assessment of the risk minutes of surgery.  We talked about her intraoperative and postoperative course.  At this point she does wish to proceed with the surgery given the failure of all forms of conservative treatment and given how long this is been going on.  Given the fact that this is detrimental effect directives daily living, her quality of life, and her mobility she does wish to go ahead and proceed with surgery.  We will work on getting this scheduled.  All questions concerns were answered and addressed.  Follow-Up Instructions: Return for 2 weeks post-op.   Orders:  Orders Placed This Encounter  Procedures  . XR HIP UNILAT W OR W/O PELVIS 1V RIGHT   No orders of the defined types were placed in this encounter.     Procedures: No procedures performed   Clinical Data: No additional findings.   Subjective: Chief Complaint  Patient presents with  . Right Hip - Pain  Patient is a very pleasant 67 year old active female has been having worsening right hip pain for about 6 to 7 years now.  She has pain in the groin and all around her right hip.  She is had some falls in the past and was seen  recently at urgent care center and was told she has severe arthritis in her right hip.  She does not have his x-rays with her today.  She tries anti-inflammatories.  She is work on hip strengthening exercises.  Her massage therapist is actually with her today and is been working on trying to loosen the muscles around her hip.  She denies any left hip issues.  She denies any back pain and denies any knee issues.  She is not a smoker and is not a diabetic.  She has no active medical problems.  She is a long-term patient of Dr. Andria Frames.  HPI  Review of Systems She currently denies any headache, chest pain, short of breath, fever, chills, nausea, vomiting.  Objective: Vital Signs: There were no vitals taken for this visit.  Physical Exam She is alert oriented x3 in no acute distress Ortho Exam Examination of her left hip is normal examination of her right hip shows severe pain and guarding on internal/external rotation. Specialty Comments:  No specialty comments available.  Imaging: Xr Hip Unilat W Or W/o Pelvis 1v Right  Result Date: 08/05/2017 Standing AP pelvis and lateral right hip she has severe end-stage  arthritis of the right hip left hip appears normal.  The right hip has significant joint space loss.  There is cystic change in the femoral head and periarticular osteophytes in both the acetabulum and the femoral head.    PMFS History: Patient Active Problem List   Diagnosis Date Noted  . Unilateral primary osteoarthritis, right hip 08/05/2017  . Palpitations 03/30/2017  . Dyspnea 03/26/2017  . Screen for colon cancer 11/06/2016  . Tinnitus 06/20/2016  . Essential hypertension, benign 10/24/2015  . Mild vitamin D deficiency 07/31/2015  . Multiple actinic keratoses 02/21/2015  . Transient elevated blood pressure 09/01/2014  . Rotator cuff syndrome, left 09/08/2013  . Breast cancer screening 11/10/2011  . Personal history of colonic adenoma 04/21/2011  . Routine general medical  examination at a health care facility 04/26/2010  . Hypothyroidism 04/16/2006  . HYPERCHOLESTEROLEMIA 04/16/2006  . DIVERTICULITIS OF COLON, NOS 04/16/2006  . OSTEOARTHRITIS OF SPINE, NOS 04/16/2006   Past Medical History:  Diagnosis Date  . Diverticulosis   . Thyroid disease   . Toxemia in pregnancy   . Varicose veins of legs    surgery scheduled for  07/01/11    Family History  Problem Relation Age of Onset  . Colon polyps Mother   . Cancer Mother   . Alzheimer's disease Father   . Parkinson's disease Brother   . Alcohol abuse Sister   . Alcohol abuse Brother   . Alcohol abuse Brother     Past Surgical History:  Procedure Laterality Date  . APPENDECTOMY    . BREAST BIOPSY Right   . BREAST REDUCTION SURGERY Bilateral   . COLON SURGERY    . COLONOSCOPY    . INGUINAL HERNIA REPAIR     right side  . REDUCTION MAMMAPLASTY    . Ruptured colon     In 1995  . THYROID SURGERY     right side   Social History   Occupational History  . Not on file  Tobacco Use  . Smoking status: Never Smoker  . Smokeless tobacco: Never Used  Substance and Sexual Activity  . Alcohol use: No  . Drug use: No  . Sexual activity: Not Currently

## 2017-08-10 ENCOUNTER — Other Ambulatory Visit (INDEPENDENT_AMBULATORY_CARE_PROVIDER_SITE_OTHER): Payer: Self-pay | Admitting: Orthopaedic Surgery

## 2017-08-10 DIAGNOSIS — M1611 Unilateral primary osteoarthritis, right hip: Secondary | ICD-10-CM

## 2017-08-11 NOTE — Pre-Procedure Instructions (Signed)
Tammy Castro  08/12/2017      [facility] - [place], [place] [address] Phone: [phone] Fax: [phone]  [facility] [place] - [address] [place] [address] Phone: [phone] Fax: [phone]    Your procedure is scheduled on Tues., August 25, 2017 from 2:48PM-4:36PM  Report to [facility] Admitting Entrance "A" at 12:45PM  Call this number if you have problems the morning of surgery:  [phone]   Remember:  Do not eat or drink after midnight on July 8th    Take these medicines the morning of surgery with A SIP OF WATER: NONE  7 days before surgery (7/2), stop taking all Other Aspirin Products, Vitamins, Fish oils, and Herbal medications. Also stop all NSAIDS i.e. Advil, Ibuprofen, Motrin, Aleve, Anaprox, Naproxen, BC, Goody Powders, and all Supplements.   Do not wear jewelry, make-up or nail polish.  Do not wear lotions, powders, or perfumes, or deodorant.  Do not shave 48 hours prior to surgery.    Do not bring valuables to the hospital.  [facility] is not responsible for any belongings or valuables.  Contacts, dentures or bridgework may not be worn into surgery.  Leave your suitcase in the car.  After surgery it may be brought to your room.  For patients admitted to the hospital, discharge time will be determined by your treatment team.  Patients discharged the day of surgery will not be allowed to drive home.   Special instructions:   [facility]- Preparing For Surgery  Before surgery, you can play an important role. Because skin is not sterile, your skin needs to be as free of germs as possible. You can reduce the number of germs on your skin by washing with CHG (chlorahexidine gluconate) Soap before surgery.  CHG is an antiseptic cleaner which kills germs and bonds with the skin to  continue killing germs even after washing.    Oral Hygiene is also important to reduce your risk of infection.  Remember - BRUSH YOUR TEETH THE MORNING OF SURGERY WITH YOUR REGULAR TOOTHPASTE  Please do not use if you have an allergy to CHG or antibacterial soaps. If your skin becomes reddened/irritated stop using the CHG.  Do not shave (including legs and underarms) for at least 48 hours prior to first CHG shower. It is OK to shave your face.  Please follow these instructions carefully.   1. Shower the NIGHT BEFORE SURGERY and the MORNING OF SURGERY with CHG.   2. If you chose to wash your hair, wash your hair first as usual with your normal shampoo.  3. After you shampoo, rinse your hair and body thoroughly to remove the shampoo.  4. Use CHG as you would any other liquid soap. You can apply CHG directly to the skin and wash gently with a scrungie or a clean washcloth.   5. Apply the CHG Soap to your body ONLY FROM THE NECK DOWN.  Do not use on open wounds or open sores. Avoid contact with your eyes, ears, mouth and genitals (private parts). Wash Face and genitals (private parts)  with your normal soap.  6. Wash thoroughly, paying special attention to the area where your surgery will be performed.  7. Thoroughly rinse your body with warm water from the neck down.  8. DO NOT  shower/wash with your normal soap after using and rinsing off the CHG Soap.  9. Pat yourself dry with a CLEAN TOWEL.  10. Wear CLEAN PAJAMAS to bed the night before surgery, wear comfortable clothes the morning of surgery  11. Place CLEAN SHEETS on your bed the night of your first shower and DO NOT SLEEP WITH PETS.  Day of Surgery:  Do not apply any deodorants/lotions.  Please wear clean clothes to the hospital/surgery center.   Remember to brush your teeth WITH YOUR REGULAR TOOTHPASTE.  Please read over the following fact sheets that you were given. Pain Booklet, Coughing and Deep Breathing, MRSA  Information and Surgical Site Infection Prevention

## 2017-08-12 ENCOUNTER — Other Ambulatory Visit: Payer: Self-pay

## 2017-08-12 ENCOUNTER — Encounter: Payer: Self-pay | Admitting: Family Medicine

## 2017-08-12 ENCOUNTER — Ambulatory Visit (INDEPENDENT_AMBULATORY_CARE_PROVIDER_SITE_OTHER): Payer: Medicare HMO | Admitting: Family Medicine

## 2017-08-12 ENCOUNTER — Encounter (HOSPITAL_COMMUNITY)
Admission: RE | Admit: 2017-08-12 | Discharge: 2017-08-12 | Disposition: A | Discharge status: Home / Self Care | Payer: Medicare HMO | Source: Ambulatory Visit | Attending: Orthopaedic Surgery | Admitting: Orthopaedic Surgery

## 2017-08-12 ENCOUNTER — Encounter (HOSPITAL_COMMUNITY): Payer: Self-pay

## 2017-08-12 DIAGNOSIS — E038 Other specified hypothyroidism: Secondary | ICD-10-CM | POA: Diagnosis not present

## 2017-08-12 DIAGNOSIS — I10 Essential (primary) hypertension: Secondary | ICD-10-CM | POA: Diagnosis not present

## 2017-08-12 DIAGNOSIS — M1611 Unilateral primary osteoarthritis, right hip: Secondary | ICD-10-CM

## 2017-08-12 DIAGNOSIS — Z01812 Encounter for preprocedural laboratory examination: Secondary | ICD-10-CM | POA: Diagnosis not present

## 2017-08-12 DIAGNOSIS — E7800 Pure hypercholesterolemia, unspecified: Secondary | ICD-10-CM | POA: Diagnosis not present

## 2017-08-12 DIAGNOSIS — Z0000 Encounter for general adult medical examination without abnormal findings: Secondary | ICD-10-CM | POA: Diagnosis not present

## 2017-08-12 HISTORY — DX: Malignant (primary) neoplasm, unspecified: C80.1

## 2017-08-12 HISTORY — DX: Osteoarthritis of hip, unspecified: M16.9

## 2017-08-12 HISTORY — DX: Prediabetes: R73.03

## 2017-08-12 HISTORY — DX: Adverse effect of unspecified anesthetic, initial encounter: T41.45XA

## 2017-08-12 HISTORY — DX: Other specified postprocedural states: Z98.890

## 2017-08-12 HISTORY — DX: Unspecified osteoarthritis, unspecified site: M19.90

## 2017-08-12 HISTORY — DX: Other complications of anesthesia, initial encounter: T88.59XA

## 2017-08-12 HISTORY — DX: Other specified postprocedural states: R11.2

## 2017-08-12 LAB — BASIC METABOLIC PANEL
Anion gap: 8 (ref 5–15)
BUN: 14 mg/dL (ref 8–23)
CALCIUM: 9.3 mg/dL (ref 8.9–10.3)
CHLORIDE: 104 mmol/L (ref 98–111)
CO2: 24 mmol/L (ref 22–32)
CREATININE: 0.67 mg/dL (ref 0.44–1.00)
Glucose, Bld: 107 mg/dL — ABNORMAL HIGH (ref 70–99)
Potassium: 4 mmol/L (ref 3.5–5.1)
SODIUM: 136 mmol/L (ref 135–145)

## 2017-08-12 LAB — CBC
HCT: 43.1 % (ref 36.0–46.0)
HEMOGLOBIN: 14 g/dL (ref 12.0–15.0)
MCH: 29.4 pg (ref 26.0–34.0)
MCHC: 32.5 g/dL (ref 30.0–36.0)
MCV: 90.4 fL (ref 78.0–100.0)
PLATELETS: 321 10*3/uL (ref 150–400)
RBC: 4.77 MIL/uL (ref 3.87–5.11)
RDW: 13.4 % (ref 11.5–15.5)
WBC: 5 10*3/uL (ref 4.0–10.5)

## 2017-08-12 LAB — SURGICAL PCR SCREEN
MRSA, PCR: NEGATIVE
Staphylococcus aureus: NEGATIVE

## 2017-08-12 MED ORDER — ZOSTER VAC RECOMB ADJUVANTED 50 MCG/0.5ML IM SUSR: 0.5000 mL | Freq: Once | INTRAMUSCULAR | 1 refills | Status: AC

## 2017-08-12 NOTE — Patient Instructions (Addendum)
I will call with you with your cholesterol result and I will put in a letter. Good luck with your hip surgery on July 9. I put in a referral to Dr. Jenne Campus.  Please make an appointment on the way out. I am delighted how well you are taking care of yourself.   I hope things settle down with your kids.  You have two beautiful grandchildren.   I sent in the prescription for the new Shingles vaccine.  You will need the second shot two months later.  Call us when you get the shot so that we can update your record.   It is always good to see you.

## 2017-08-12 NOTE — Progress Notes (Signed)
PCP - Dr. Madison Hickman  Cardiologist - Denies  Chest x-ray - Denies  EKG - 03/26/17: (E)  Stress Test - Denies  ECHO - Denies  Cardiac Cath - Denies  Sleep Study - Denies CPAP - None  LABS- 08/12/17: CBC, BMP  ASA- Denies   Anesthesia- No  Pt denies having chest pain, sob, or fever at this time. All instructions explained to the pt, with a verbal understanding of the material. Pt agrees to go over the instructions while at home for a better understanding. The opportunity to ask questions was provided.

## 2017-08-13 ENCOUNTER — Encounter: Payer: Self-pay | Admitting: Family Medicine

## 2017-08-13 LAB — LDL CHOLESTEROL, DIRECT: LDL DIRECT: 118 mg/dL — AB (ref 0–99)

## 2017-08-14 ENCOUNTER — Encounter: Payer: Self-pay | Admitting: Family Medicine

## 2017-08-14 NOTE — Assessment & Plan Note (Signed)
Low risk for planned elective right hip replacement surgery.

## 2017-08-14 NOTE — Progress Notes (Signed)
   Subjective:    Patient ID: Tammy Castro, female    DOB: 03/04/1950, 67 y.o.   MRN: [id number]  HPI  Annual physical: Tammy Castro is generally healthy and has only a few complaints.  Issues 1. High cholesterol - primary prevention.  Treated by diet and exercise.  She has worked diligently on diet and is down 12 lbs.  Also exercise, but limited secondary to right hip pain. 2. Right hip pain, known osteoarthritis.  Plans Hip replacement surg on July 9 with Dr. Rush Farmer.  Doing for good reasons, wants to be more active.   3. Hypothyroidism.  On replacement.  Tolerating well without symptoms. 4. Hypertension.  Tolerating low dose HCTZ well without symptoms.    Lifestyle: Very healthy with diet and exercise.  No at risk behaviors.  Quit alcohol completely.  Stress is higher than we would like.  She still misses her husband (died of prostate cancer  ~3 years ago).  Both children worry her.  She is concerned about her daughter's spouse.  She is also concerned that her son keeps getting drawn toward emotionally unhealthy women.  He finally terminated a very unhealthy relationship, but his new girlfriend seems to have issues.  Not depressed.    HPDP Up to date on all screening labs.   She has not yet had Shingrix vaccination.  We discussed    Review of Systems Denies HA, blurred vision, double vision.  No skin changes.  No neck pain or masses.  Denies CP, DOE palpitations.  No abd pain.  No change in bowel, bladder or appetite.  No bleeding.     Objective:   Physical Exam VS noted HEENT WNL Neck supple without masses Lungs clear Cardiac rRR without m or g abd benign. Ext no edema Neuro, Motor and sensory grossly intact.  Gait, mentation and affect normal.        Assessment & Plan:

## 2017-08-14 NOTE — Assessment & Plan Note (Signed)
Recent TSH OK.  Stable.

## 2017-08-14 NOTE — Assessment & Plan Note (Signed)
Recheck dLDL with good wt loss

## 2017-08-14 NOTE — Assessment & Plan Note (Signed)
Healthy woman with no at risk behaviors.   

## 2017-08-19 ENCOUNTER — Other Ambulatory Visit (INDEPENDENT_AMBULATORY_CARE_PROVIDER_SITE_OTHER): Payer: Self-pay | Admitting: Orthopaedic Surgery

## 2017-08-24 MED ORDER — SODIUM CHLORIDE 0.9 % IV SOLN
1000.0000 mg | INTRAVENOUS | Status: AC
  Administered 2017-08-25: 1000 mg via INTRAVENOUS
  Filled 2017-08-24: qty 1100
  Filled 2017-08-24: qty 10

## 2017-08-25 ENCOUNTER — Other Ambulatory Visit: Payer: Self-pay

## 2017-08-25 ENCOUNTER — Encounter (HOSPITAL_COMMUNITY)
Admission: RE | Disposition: A | Discharge status: Home Health | Payer: Self-pay | Source: Ambulatory Visit | Attending: Orthopaedic Surgery

## 2017-08-25 ENCOUNTER — Inpatient Hospital Stay (HOSPITAL_COMMUNITY): Payer: Medicare HMO

## 2017-08-25 ENCOUNTER — Inpatient Hospital Stay (HOSPITAL_COMMUNITY): Payer: Medicare HMO | Admitting: Certified Registered Nurse Anesthetist

## 2017-08-25 ENCOUNTER — Encounter (HOSPITAL_COMMUNITY): Payer: Self-pay | Admitting: *Deleted

## 2017-08-25 ENCOUNTER — Inpatient Hospital Stay (HOSPITAL_COMMUNITY)
Admission: RE | Admit: 2017-08-25 | Discharge: 2017-08-30 | DRG: 470 | Disposition: A | Discharge status: Home Health | Payer: Medicare HMO | Source: Ambulatory Visit | Attending: Orthopaedic Surgery | Admitting: Orthopaedic Surgery

## 2017-08-25 DIAGNOSIS — Z419 Encounter for procedure for purposes other than remedying health state, unspecified: Secondary | ICD-10-CM

## 2017-08-25 DIAGNOSIS — M25551 Pain in right hip: Secondary | ICD-10-CM | POA: Diagnosis present

## 2017-08-25 DIAGNOSIS — I10 Essential (primary) hypertension: Secondary | ICD-10-CM | POA: Diagnosis present

## 2017-08-25 DIAGNOSIS — M1611 Unilateral primary osteoarthritis, right hip: Secondary | ICD-10-CM | POA: Diagnosis not present

## 2017-08-25 DIAGNOSIS — E039 Hypothyroidism, unspecified: Secondary | ICD-10-CM | POA: Diagnosis present

## 2017-08-25 DIAGNOSIS — Z96641 Presence of right artificial hip joint: Secondary | ICD-10-CM | POA: Diagnosis not present

## 2017-08-25 DIAGNOSIS — K649 Unspecified hemorrhoids: Secondary | ICD-10-CM | POA: Diagnosis not present

## 2017-08-25 DIAGNOSIS — Z471 Aftercare following joint replacement surgery: Secondary | ICD-10-CM | POA: Diagnosis not present

## 2017-08-25 DIAGNOSIS — R7303 Prediabetes: Secondary | ICD-10-CM | POA: Diagnosis not present

## 2017-08-25 DIAGNOSIS — E7800 Pure hypercholesterolemia, unspecified: Secondary | ICD-10-CM | POA: Diagnosis not present

## 2017-08-25 HISTORY — PX: TOTAL HIP ARTHROPLASTY: SHX124

## 2017-08-25 SURGERY — ARTHROPLASTY, HIP, TOTAL, ANTERIOR APPROACH
Anesthesia: General | Site: Hip | Laterality: Right

## 2017-08-25 MED ORDER — FENTANYL CITRATE (PF) 100 MCG/2ML IJ SOLN
25.0000 ug | INTRAMUSCULAR | Status: DC | PRN
  Administered 2017-08-25 (×3): 50 ug via INTRAVENOUS

## 2017-08-25 MED ORDER — METOCLOPRAMIDE HCL 5 MG/ML IJ SOLN
10.0000 mg | Freq: Once | INTRAMUSCULAR | Status: AC | PRN
  Administered 2017-08-25: 10 mg via INTRAVENOUS

## 2017-08-25 MED ORDER — LECITHIN 400 MG PO CAPS: 400.0000 mg | ORAL_CAPSULE | Freq: Every day | ORAL | Status: DC

## 2017-08-25 MED ORDER — SUGAMMADEX SODIUM 200 MG/2ML IV SOLN
INTRAVENOUS | Status: DC | PRN
  Administered 2017-08-25: 200 mg via INTRAVENOUS

## 2017-08-25 MED ORDER — DIPHENHYDRAMINE HCL 50 MG/ML IJ SOLN
INTRAMUSCULAR | Status: AC
  Filled 2017-08-25: qty 1

## 2017-08-25 MED ORDER — FENTANYL CITRATE (PF) 100 MCG/2ML IJ SOLN
INTRAMUSCULAR | Status: AC
  Administered 2017-08-25: 50 ug via INTRAVENOUS
  Filled 2017-08-25: qty 2

## 2017-08-25 MED ORDER — ONDANSETRON HCL 4 MG PO TABS
4.0000 mg | ORAL_TABLET | Freq: Four times a day (QID) | ORAL | Status: DC | PRN
Start: 2017-08-25 — End: 2017-08-30

## 2017-08-25 MED ORDER — LIDOCAINE 2% (20 MG/ML) 5 ML SYRINGE
INTRAMUSCULAR | Status: DC | PRN
  Administered 2017-08-25: 80 mg via INTRAVENOUS

## 2017-08-25 MED ORDER — ONDANSETRON HCL 4 MG/2ML IJ SOLN
INTRAMUSCULAR | Status: AC
  Filled 2017-08-25: qty 2

## 2017-08-25 MED ORDER — PANTOPRAZOLE SODIUM 40 MG PO TBEC
40.0000 mg | DELAYED_RELEASE_TABLET | Freq: Every day | ORAL | Status: DC
  Administered 2017-08-25 – 2017-08-30 (×6): 40 mg via ORAL
  Filled 2017-08-25 (×6): qty 1

## 2017-08-25 MED ORDER — FENTANYL CITRATE (PF) 100 MCG/2ML IJ SOLN
INTRAMUSCULAR | Status: AC
  Filled 2017-08-25: qty 2

## 2017-08-25 MED ORDER — METOCLOPRAMIDE HCL 5 MG PO TABS
5.0000 mg | ORAL_TABLET | Freq: Three times a day (TID) | ORAL | Status: DC | PRN
Start: 2017-08-25 — End: 2017-08-30

## 2017-08-25 MED ORDER — SODIUM CHLORIDE 0.9 % IR SOLN
Status: DC | PRN
  Administered 2017-08-25: 1000 mL

## 2017-08-25 MED ORDER — DIPHENHYDRAMINE HCL 12.5 MG/5ML PO ELIX
12.5000 mg | ORAL_SOLUTION | ORAL | Status: DC | PRN
Start: 2017-08-25 — End: 2017-08-30
  Administered 2017-08-26: 12.5 mg via ORAL
  Administered 2017-08-28: 25 mg via ORAL
  Filled 2017-08-25: qty 10
  Filled 2017-08-25: qty 5

## 2017-08-25 MED ORDER — ROCURONIUM BROMIDE 50 MG/5ML IV SOSY
PREFILLED_SYRINGE | INTRAVENOUS | Status: DC | PRN
  Administered 2017-08-25: 50 mg via INTRAVENOUS

## 2017-08-25 MED ORDER — HYDROMORPHONE HCL 1 MG/ML IJ SOLN
0.5000 mg | INTRAMUSCULAR | Status: DC | PRN
  Administered 2017-08-26: 1 mg via INTRAVENOUS
  Filled 2017-08-25: qty 1

## 2017-08-25 MED ORDER — METOCLOPRAMIDE HCL 5 MG/ML IJ SOLN
INTRAMUSCULAR | Status: AC
  Administered 2017-08-25: 10 mg via INTRAVENOUS
  Filled 2017-08-25: qty 2

## 2017-08-25 MED ORDER — VITAMIN E 45 MG (100 UNIT) PO CAPS
1000.0000 [IU] | ORAL_CAPSULE | Freq: Every day | ORAL | Status: DC
  Administered 2017-08-26 – 2017-08-30 (×4): 1000 [IU] via ORAL
  Filled 2017-08-25 (×5): qty 2

## 2017-08-25 MED ORDER — ACETAMINOPHEN 325 MG PO TABS
325.0000 mg | ORAL_TABLET | Freq: Four times a day (QID) | ORAL | Status: DC | PRN
  Administered 2017-08-26 – 2017-08-27 (×3): 650 mg via ORAL
  Filled 2017-08-25 (×3): qty 2

## 2017-08-25 MED ORDER — PROPOFOL 10 MG/ML IV BOLUS
INTRAVENOUS | Status: DC | PRN
  Administered 2017-08-25: 200 mg via INTRAVENOUS

## 2017-08-25 MED ORDER — CALCIUM CARBONATE-VITAMIN D 500-200 MG-UNIT PO TABS
1.0000 | ORAL_TABLET | Freq: Two times a day (BID) | ORAL | Status: DC
  Administered 2017-08-25 – 2017-08-30 (×10): 1 via ORAL
  Filled 2017-08-25 (×10): qty 1

## 2017-08-25 MED ORDER — METHOCARBAMOL 500 MG PO TABS
500.0000 mg | ORAL_TABLET | Freq: Four times a day (QID) | ORAL | Status: DC | PRN
  Administered 2017-08-26 – 2017-08-28 (×6): 500 mg via ORAL
  Filled 2017-08-25 (×7): qty 1

## 2017-08-25 MED ORDER — DEXAMETHASONE SODIUM PHOSPHATE 10 MG/ML IJ SOLN
INTRAMUSCULAR | Status: AC
  Filled 2017-08-25: qty 1

## 2017-08-25 MED ORDER — FENTANYL CITRATE (PF) 100 MCG/2ML IJ SOLN
INTRAMUSCULAR | Status: DC | PRN
  Administered 2017-08-25 (×4): 50 ug via INTRAVENOUS

## 2017-08-25 MED ORDER — ONDANSETRON HCL 4 MG/2ML IJ SOLN
INTRAMUSCULAR | Status: DC | PRN
  Administered 2017-08-25 (×2): 4 mg via INTRAVENOUS

## 2017-08-25 MED ORDER — MEPERIDINE HCL 50 MG/ML IJ SOLN: 6.2500 mg | INTRAMUSCULAR | Status: DC | PRN

## 2017-08-25 MED ORDER — MIDAZOLAM HCL 2 MG/2ML IJ SOLN
INTRAMUSCULAR | Status: DC | PRN
  Administered 2017-08-25: 2 mg via INTRAVENOUS

## 2017-08-25 MED ORDER — SODIUM CHLORIDE 0.9 % IV SOLN
INTRAVENOUS | Status: DC
  Administered 2017-08-25: 19:00:00 via INTRAVENOUS

## 2017-08-25 MED ORDER — GLYCOPYRROLATE 0.2 MG/ML IV SOSY
PREFILLED_SYRINGE | INTRAVENOUS | Status: AC
  Filled 2017-08-25: qty 3

## 2017-08-25 MED ORDER — MAGNESIUM HYDROXIDE 400 MG/5ML PO SUSP
30.0000 mL | Freq: Every day | ORAL | Status: DC | PRN
  Administered 2017-08-29: 30 mL via ORAL
  Filled 2017-08-25: qty 30

## 2017-08-25 MED ORDER — METHOCARBAMOL 1000 MG/10ML IJ SOLN
500.0000 mg | Freq: Four times a day (QID) | INTRAVENOUS | Status: DC | PRN
  Administered 2017-08-25: 500 mg via INTRAVENOUS
  Filled 2017-08-25: qty 550

## 2017-08-25 MED ORDER — ONDANSETRON HCL 4 MG/2ML IJ SOLN
4.0000 mg | Freq: Four times a day (QID) | INTRAMUSCULAR | Status: DC | PRN
  Administered 2017-08-26 – 2017-08-28 (×3): 4 mg via INTRAVENOUS
  Filled 2017-08-25 (×3): qty 2

## 2017-08-25 MED ORDER — CEFAZOLIN SODIUM-DEXTROSE 1-4 GM/50ML-% IV SOLN
1.0000 g | Freq: Four times a day (QID) | INTRAVENOUS | Status: AC
  Administered 2017-08-25 – 2017-08-26 (×2): 1 g via INTRAVENOUS
  Filled 2017-08-25 (×2): qty 50

## 2017-08-25 MED ORDER — OXYCODONE HCL 5 MG PO TABS
10.0000 mg | ORAL_TABLET | ORAL | Status: DC | PRN
  Filled 2017-08-25 (×2): qty 2

## 2017-08-25 MED ORDER — PROPOFOL 10 MG/ML IV BOLUS
INTRAVENOUS | Status: AC
Start: 2017-08-25 — End: ?
  Filled 2017-08-25: qty 80

## 2017-08-25 MED ORDER — DOCUSATE SODIUM 100 MG PO CAPS
100.0000 mg | ORAL_CAPSULE | Freq: Two times a day (BID) | ORAL | Status: DC
  Administered 2017-08-25 – 2017-08-29 (×8): 100 mg via ORAL
  Filled 2017-08-25 (×9): qty 1

## 2017-08-25 MED ORDER — MENTHOL 3 MG MT LOZG: 1.0000 | LOZENGE | OROMUCOSAL | Status: DC | PRN

## 2017-08-25 MED ORDER — HYDROCHLOROTHIAZIDE 25 MG PO TABS
12.5000 mg | ORAL_TABLET | Freq: Every day | ORAL | Status: DC
  Administered 2017-08-27 – 2017-08-30 (×3): 12.5 mg via ORAL
  Filled 2017-08-25 (×5): qty 1

## 2017-08-25 MED ORDER — ASPIRIN 81 MG PO CHEW
81.0000 mg | CHEWABLE_TABLET | Freq: Two times a day (BID) | ORAL | Status: DC
  Administered 2017-08-25 – 2017-08-30 (×10): 81 mg via ORAL
  Filled 2017-08-25 (×9): qty 1

## 2017-08-25 MED ORDER — VITAMIN E 45 MG (100 UNIT) PO CAPS: 1000.0000 [IU] | ORAL_CAPSULE | Freq: Every day | ORAL | Status: DC

## 2017-08-25 MED ORDER — CEFAZOLIN SODIUM-DEXTROSE 2-4 GM/100ML-% IV SOLN
2.0000 g | INTRAVENOUS | Status: AC
  Administered 2017-08-25: 2 g via INTRAVENOUS
  Filled 2017-08-25: qty 100

## 2017-08-25 MED ORDER — SUGAMMADEX SODIUM 200 MG/2ML IV SOLN
INTRAVENOUS | Status: AC
  Filled 2017-08-25: qty 2

## 2017-08-25 MED ORDER — DIPHENHYDRAMINE HCL 50 MG/ML IJ SOLN
INTRAMUSCULAR | Status: DC | PRN
  Administered 2017-08-25: 12.5 mg via INTRAVENOUS

## 2017-08-25 MED ORDER — OXYCODONE HCL 5 MG PO TABS
5.0000 mg | ORAL_TABLET | ORAL | Status: DC | PRN
  Administered 2017-08-25: 5 mg via ORAL
  Administered 2017-08-25: 10 mg via ORAL
  Administered 2017-08-26: 5 mg via ORAL
  Administered 2017-08-26 – 2017-08-27 (×7): 10 mg via ORAL
  Administered 2017-08-28 – 2017-08-30 (×3): 5 mg via ORAL
  Filled 2017-08-25: qty 2
  Filled 2017-08-25 (×2): qty 1
  Filled 2017-08-25 (×3): qty 2
  Filled 2017-08-25: qty 1
  Filled 2017-08-25 (×6): qty 2

## 2017-08-25 MED ORDER — DEXAMETHASONE SODIUM PHOSPHATE 10 MG/ML IJ SOLN
INTRAMUSCULAR | Status: DC | PRN
  Administered 2017-08-25: 10 mg via INTRAVENOUS

## 2017-08-25 MED ORDER — MIDAZOLAM HCL 2 MG/2ML IJ SOLN
INTRAMUSCULAR | Status: AC
  Filled 2017-08-25: qty 2

## 2017-08-25 MED ORDER — METOCLOPRAMIDE HCL 5 MG/ML IJ SOLN
5.0000 mg | Freq: Three times a day (TID) | INTRAMUSCULAR | Status: DC | PRN
Start: 2017-08-25 — End: 2017-08-30
  Administered 2017-08-27: 10 mg via INTRAVENOUS
  Filled 2017-08-25: qty 2

## 2017-08-25 MED ORDER — LACTATED RINGERS IV SOLN
INTRAVENOUS | Status: DC
  Administered 2017-08-25 (×2): via INTRAVENOUS

## 2017-08-25 MED ORDER — ALUM & MAG HYDROXIDE-SIMETH 200-200-20 MG/5ML PO SUSP: 30.0000 mL | ORAL | Status: DC | PRN

## 2017-08-25 MED ORDER — PHENOL 1.4 % MT LIQD
1.0000 | OROMUCOSAL | Status: DC | PRN
  Filled 2017-08-25: qty 177

## 2017-08-25 MED ORDER — GLYCOPYRROLATE PF 0.2 MG/ML IJ SOSY
PREFILLED_SYRINGE | INTRAMUSCULAR | Status: DC | PRN
  Administered 2017-08-25: .2 mg via INTRAVENOUS

## 2017-08-25 SURGICAL SUPPLY — 36 items
APL SKNCLS STERI-STRIP NONHPOA (GAUZE/BANDAGES/DRESSINGS) ×1
BAG SPEC THK2 15X12 ZIP CLS (MISCELLANEOUS)
BAG ZIPLOCK 12X15 (MISCELLANEOUS) IMPLANT
BENZOIN TINCTURE PRP APPL 2/3 (GAUZE/BANDAGES/DRESSINGS) ×3 IMPLANT
BLADE SAW SGTL 18X1.27X75 (BLADE) ×2 IMPLANT
BLADE SAW SGTL 18X1.27X75MM (BLADE) ×1
CAPT HIP TOTAL 2 ×3 IMPLANT
CLOSURE WOUND 1/2 X4 (GAUZE/BANDAGES/DRESSINGS) ×1
COVER PERINEAL POST (MISCELLANEOUS) ×3 IMPLANT
COVER SURGICAL LIGHT HANDLE (MISCELLANEOUS) ×3 IMPLANT
DRAPE STERI IOBAN 125X83 (DRAPES) ×3 IMPLANT
DRAPE U-SHAPE 47X51 STRL (DRAPES) ×6 IMPLANT
DRSG AQUACEL AG ADV 3.5X 6 (GAUZE/BANDAGES/DRESSINGS) ×2 IMPLANT
DRSG AQUACEL AG ADV 3.5X10 (GAUZE/BANDAGES/DRESSINGS) ×3 IMPLANT
DURAPREP 26ML APPLICATOR (WOUND CARE) ×3 IMPLANT
ELECT REM PT RETURN 15FT ADLT (MISCELLANEOUS) ×3 IMPLANT
GAUZE XEROFORM 1X8 LF (GAUZE/BANDAGES/DRESSINGS) IMPLANT
GLOVE BIO SURGEON STRL SZ7.5 (GLOVE) ×3 IMPLANT
GLOVE BIOGEL PI IND STRL 8 (GLOVE) ×2 IMPLANT
GLOVE BIOGEL PI INDICATOR 8 (GLOVE) ×4
GLOVE ECLIPSE 8.0 STRL XLNG CF (GLOVE) ×3 IMPLANT
GOWN STRL REUS W/TWL XL LVL3 (GOWN DISPOSABLE) ×6 IMPLANT
HANDPIECE INTERPULSE COAX TIP (DISPOSABLE) ×3
HOLDER FOLEY CATH W/STRAP (MISCELLANEOUS) ×3 IMPLANT
PACK ANTERIOR HIP CUSTOM (KITS) ×3 IMPLANT
SET HNDPC FAN SPRY TIP SCT (DISPOSABLE) ×1 IMPLANT
STAPLER VISISTAT 35W (STAPLE) IMPLANT
STRIP CLOSURE SKIN 1/2X4 (GAUZE/BANDAGES/DRESSINGS) ×1 IMPLANT
SUT ETHIBOND NAB CT1 #1 30IN (SUTURE) ×3 IMPLANT
SUT MNCRL AB 4-0 PS2 18 (SUTURE) IMPLANT
SUT VIC AB 0 CT1 36 (SUTURE) ×3 IMPLANT
SUT VIC AB 1 CT1 36 (SUTURE) ×3 IMPLANT
SUT VIC AB 2-0 CT1 27 (SUTURE) ×3
SUT VIC AB 2-0 CT1 TAPERPNT 27 (SUTURE) ×1 IMPLANT
TRAY FOLEY MTR SLVR 16FR STAT (SET/KITS/TRAYS/PACK) ×1 IMPLANT
YANKAUER SUCT BULB TIP 10FT TU (MISCELLANEOUS) ×3 IMPLANT

## 2017-08-25 NOTE — Anesthesia Postprocedure Evaluation (Signed)
Anesthesia Post Note  Patient: Tammy Castro  Procedure(s) Performed: RIGHT TOTAL HIP ARTHROPLASTY ANTERIOR APPROACH (Right Hip)     Patient location during evaluation: PACU Anesthesia Type: General Level of consciousness: awake and alert Pain management: pain level controlled Vital Signs Assessment: post-procedure vital signs reviewed and stable Respiratory status: spontaneous breathing, nonlabored ventilation and respiratory function stable Cardiovascular status: blood pressure returned to baseline and stable Postop Assessment: no apparent nausea or vomiting Anesthetic complications: no    Last Vitals:  Vitals:   08/25/17 1745 08/25/17 1800  BP: (!) 148/69 134/78  Pulse: (!) 58 62  Resp: 12 18  Temp:    SpO2: 98% 97%    Last Pain:  Vitals:   08/25/17 1800  TempSrc:   PainSc: 3                  Lynda Rainwater

## 2017-08-25 NOTE — Brief Op Note (Signed)
08/25/2017  4:59 PM  PATIENT:  Tammy Castro  67 y.o. female  PRE-OPERATIVE DIAGNOSIS:  osteoarthritis right hip  POST-OPERATIVE DIAGNOSIS:  osteoarthritis right hip  PROCEDURE:  Procedure(s): RIGHT TOTAL HIP ARTHROPLASTY ANTERIOR APPROACH (Right)  SURGEON:  Surgeon(s) and Role:    Mcarthur Rossetti, MD - Primary  PHYSICIAN ASSISTANT: Benita Stabile, PA-C  ANESTHESIA:   general  EBL:  200 mL   COUNTS:  YES  DICTATION: .Other Dictation: Dictation Number [id number]  PLAN OF CARE: Admit to inpatient   PATIENT DISPOSITION:  PACU - hemodynamically stable.   Delay start of Pharmacological VTE agent (>24hrs) due to surgical blood loss or risk of bleeding: no

## 2017-08-25 NOTE — Op Note (Signed)
NAME: Tammy Castro, HELFRICH MEDICAL RECORD ON:[id number] ACCOUNT [id number] DATE OF BIRTH:03/12/1950 FACILITY: [facility] LOCATION: [facility] PHYSICIAN:Irmgard Rampersaud Kerry Fort, MD  OPERATIVE REPORT  DATE OF PROCEDURE:  08/25/2017  PREOPERATIVE DIAGNOSIS:  Primary osteoarthritis and degenerative joint disease, right hip.  POSTOPERATIVE DIAGNOSIS:  Primary osteoarthritis and degenerative joint disease, right hip.  PROCEDURE:  Right total hip arthroplasty through direct anterior approach.  IMPLANTS:  DePuy Sector Gription acetabular component size 54 and a single screw, size 36+0 polyethylene liner, size 13 Corail femoral component with standard offset with size 36 -2 metal hip ball.  SURGEON:  Lind Guest. Ninfa Linden, MD  ASSISTANT:  Erskine Emery, PA-C  ANESTHESIA:  General.  ANTIBIOTICS:  2 g IV Ancef.  ESTIMATED BLOOD LOSS:  200 mL.  COMPLICATIONS:  None.  INDICATIONS:  The patient is a very pleasant 67 year old female with debilitating arthritis involving her right hip.  Her pain is daily, and at this point this is detrimentally affecting her activities of daily living, her quality of life, and mobility.   At this point with failure of conservative treatment, she does wish to proceed with total hip arthroplasty through direct anterior approach.  She understands fully the risk of acute blood loss anemia, nerve or vessel injury, fracture, infection,  dislocation, DVT.  She understands our goals are to decrease pain, improve mobility and overall improve quality of life.  DESCRIPTION OF PROCEDURE:  After informed consent was obtained and appropriate right hip was marked, she was brought to the operating room, and general anesthesia was obtained while she was on her stretcher.  Traction boots were placed on both her feet.   Next, she was placed supine on the Hana fracture table with a perineal post in place and both legs in an in-line skeletal traction device with no traction applied.  Her  right operative hip was prepped and draped with DuraPrep and sterile drapes.  A  time-out was called, and she was identified as correct patient and correct right hip.  I then made an incision just inferior and posterior to the anterior superior iliac spine and carried this obliquely down the leg.  We dissected down tensor fascia lata  muscle.  The tensor fascia was then divided longitudinally.  We proceeded with direct anterior approach to the hip.  We identified and cauterized  circumflex vessels and identified the hip capsule.  We opened up the hip capsule in an L-type format,  finding moderate joint effusion and significant arthritis throughout her right hip.  We placed Cobra retractors around the medial and lateral femoral neck and then made a femoral neck cut with an oscillating saw just proximal to the lesser trochanter and  completed this with an osteotome.  We placed a corkscrew guide in the femoral head and removed the femoral head in its entirety and found it to be completely devoid of cartilage.  I then placed a bent Hohmann over the medial acetabular rim and removed  remnants of the acetabular labrum and other debris.  We then began reaming under direct visualization from a size 44 reamer going in stepwise increments up to a size 53 with all reamers under direct visualization, the last reamer under direct fluoroscopy  so we could obtain our depth of reaming, our inclination and anteversion.  Once I was pleased with this, I placed the real DePuy Sector Gription acetabular component size 54 and a single screw.  We placed a 36+0 neutral polyethylene liner for that size  acetabular component.  Attention was then turned  to the femur.  With the leg externally rotated to 120 degrees, extended and adducted, we were able to place a Mueller retractor medially and a Hohmann retractor above the greater trochanter, released  lateral joint capsule and used a box-cutting osteotome to enter the femoral canal and  a rongeur to lateralize.  We then began broaching from a size 8 broach using the Corail broaching system going all the way to a size 13.  With the 13 in place, we tried  a standard offset femoral neck and a 36 -2 hip ball based off her anatomy.  We reduced this in the acetabulum.  We were pleased with the leg length, offset, range of motion and stability based on clinical exam and fluoroscopic guidance assessment.  We  then dislocated the hip and removed the trial components.  We placed the real size 13 Corail femoral component with standard offset and the real 36 -2 metal hip ball and again reduced this in the acetabulum.  I was pleased with the stability.  We then  irrigated the soft tissue with normal saline solution using pulsatile lavage.  We closed the joint capsule with interrupted #1 Ethibond suture, followed by running 0 Vicryl in the tensor fascia, 0 Vicryl in the deep tissue, 2-0 Vicryl in the subcutaneous  tissue, 4-0 Monocryl subcuticular stitch and Steri-Strips on the skin.  An Aquacel dressing was applied.  She was taken off the Hana table, awakened, extubated, and taken to recovery room in stable condition.  All final counts were correct.  There were  no complications noted.  Of note, Benita Stabile, PA-C, assisted in the entire case.  His assistance was crucial in facilitating all aspects of this case.  LN/NUANCE  D:08/25/2017 T:08/25/2017 JOB:[id number]

## 2017-08-25 NOTE — Progress Notes (Signed)
PHARMACIST - PHYSICIAN ORDER COMMUNICATION  CONCERNING: P&T Medication Policy on Herbal Medications  DESCRIPTION:  This patient's order for:  Lecithin  has been noted.  This product(s) is classified as an "herbal" or natural product. Due to a lack of definitive safety studies or FDA approval, nonstandard manufacturing practices, plus the potential risk of unknown drug-drug interactions while on inpatient medications, the Pharmacy and Therapeutics Committee does not permit the use of "herbal" or natural products of this type within [facility].   ACTION TAKEN: The pharmacy department is unable to verify this order at this time and your patient has been informed of this safety policy. Please reevaluate patient's clinical condition at discharge and address if the herbal or natural product(s) should be resumed at that time.  Dia Sitter, PharmD, BCPS 08/25/2017 6:40 PM

## 2017-08-25 NOTE — H&P (Signed)
TOTAL HIP ADMISSION H&P  Patient is admitted for right total hip arthroplasty.  Subjective:  Chief Complaint: right hip pain  HPI: Tammy Castro, 67 y.o. female, has a history of pain and functional disability in the right hip(s) due to arthritis and patient has failed non-surgical conservative treatments for greater than 12 weeks to include NSAID's and/or analgesics, corticosteriod injections, flexibility and strengthening excercises, weight reduction as appropriate and activity modification.  Onset of symptoms was gradual starting 3 years ago with gradually worsening course since that time.The patient noted no past surgery on the right hip(s).  Patient currently rates pain in the right hip at 10 out of 10 with activity. Patient has night pain, worsening of pain with activity and weight bearing, pain that interfers with activities of daily living and pain with passive range of motion. Patient has evidence of subchondral sclerosis, periarticular osteophytes and joint space narrowing by imaging studies. This condition presents safety issues increasing the risk of falls.  There is no current active infection.  Patient Active Problem List   Diagnosis Date Noted  . Unilateral primary osteoarthritis, right hip 08/05/2017  . Palpitations 03/30/2017  . Screen for colon cancer 11/06/2016  . Tinnitus 06/20/2016  . Essential hypertension, benign 10/24/2015  . Mild vitamin D deficiency 07/31/2015  . Multiple actinic keratoses 02/21/2015  . Rotator cuff syndrome, left 09/08/2013  . Breast cancer screening 11/10/2011  . Personal history of colonic adenoma 04/21/2011  . Routine general medical examination at a health care facility 04/26/2010  . Hypothyroidism 04/16/2006  . HYPERCHOLESTEROLEMIA 04/16/2006  . DIVERTICULITIS OF COLON, NOS 04/16/2006  . OSTEOARTHRITIS OF SPINE, NOS 04/16/2006   Past Medical History:  Diagnosis Date  . Arthritis   . Cancer (HCC)    Left nostril  . Complication of  anesthesia   . Diverticulosis   . Osteoarthritis of hip    Right  . PONV (postoperative nausea and vomiting)    Can not remember the medicine used, but it was during the breast reduction surgery.  . Pre-diabetes   . Thyroid disease   . Toxemia in pregnancy   . Varicose veins of legs    surgery scheduled for  07/01/11    Past Surgical History:  Procedure Laterality Date  . APPENDECTOMY    . BREAST BIOPSY Right   . BREAST REDUCTION SURGERY Bilateral   . COLON SURGERY    . COLONOSCOPY    . INGUINAL HERNIA REPAIR     right side  . KNEE ARTHROSCOPY     Left  . REDUCTION MAMMAPLASTY    . Ruptured colon     In 1995  . THYROID SURGERY     right side    Current Facility-Administered Medications  Medication Dose Route Frequency Provider Last Rate Last Dose  . ceFAZolin (ANCEF) IVPB 2g/100 mL premix  2 g Intravenous On Call to OR Mcarthur Rossetti, MD      . lactated ringers infusion   Intravenous Continuous Montez Hageman, MD      . tranexamic acid (CYKLOKAPRON) 1,000 mg in sodium chloride 0.9 % 100 mL IVPB  1,000 mg Intravenous To OR Mcarthur Rossetti, MD       No Known Allergies  Social History   Tobacco Use  . Smoking status: Never Smoker  . Smokeless tobacco: Never Used  Substance Use Topics  . Alcohol use: No    Family History  Problem Relation Age of Onset  . Colon polyps Mother   . Cancer Mother   .  Alzheimer's disease Father   . Parkinson's disease Brother   . Alcohol abuse Sister   . Alcohol abuse Brother   . Alcohol abuse Brother      Review of Systems  Musculoskeletal: Positive for joint pain.  All other systems reviewed and are negative.   Objective:  Physical Exam  Constitutional: She is oriented to person, place, and time. She appears well-developed and well-nourished.  HENT:  Head: Normocephalic and atraumatic.  Eyes: Pupils are equal, round, and reactive to light. EOM are normal.  Neck: Normal range of motion. Neck supple.   Cardiovascular: Normal rate and regular rhythm.  Respiratory: Effort normal and breath sounds normal.  GI: Soft. Bowel sounds are normal.  Musculoskeletal:       Right hip: She exhibits decreased range of motion, decreased strength, tenderness and bony tenderness.  Neurological: She is alert and oriented to person, place, and time.  Skin: Skin is warm and dry.  Psychiatric: She has a normal mood and affect.    Vital signs in last 24 hours: Temp:  [98.3 F (36.8 C)] 98.3 F (36.8 C) (07/09 1326) Pulse Rate:  [64] 64 (07/09 1326) Resp:  [18] 18 (07/09 1326) BP: (132)/(72) 132/72 (07/09 1326) SpO2:  [98 %] 98 % (07/09 1326)  Labs:   Estimated body mass index is 25.53 kg/m as calculated from the following:   Height as of 08/12/17: 5\' 6"  (1.676 m).   Weight as of 08/12/17: 158 lb 3.2 oz (71.8 kg).   Imaging Review Plain radiographs demonstrate severe degenerative joint disease of the right hip(s). The bone quality appears to be excellent for age and reported activity level.    Preoperative templating of the joint replacement has been completed, documented, and submitted to the Operating Room personnel in order to optimize intra-operative equipment management.     Assessment/Plan:  End stage arthritis, right hip(s)  The patient history, physical examination, clinical judgement of the provider and imaging studies are consistent with end stage degenerative joint disease of the right hip(s) and total hip arthroplasty is deemed medically necessary. The treatment options including medical management, injection therapy, arthroscopy and arthroplasty were discussed at length. The risks and benefits of total hip arthroplasty were presented and reviewed. The risks due to aseptic loosening, infection, stiffness, dislocation/subluxation,  thromboembolic complications and other imponderables were discussed.  The patient acknowledged the explanation, agreed to proceed with the plan and consent  was signed. Patient is being admitted for inpatient treatment for surgery, pain control, PT, OT, prophylactic antibiotics, VTE prophylaxis, progressive ambulation and ADL's and discharge planning.The patient is planning to be discharged home with home health services

## 2017-08-25 NOTE — Anesthesia Preprocedure Evaluation (Signed)
Anesthesia Evaluation  Patient identified by MRN, date of birth, ID band Patient awake    Reviewed: Allergy & Precautions, NPO status , Patient's Chart, lab work & pertinent test results  History of Anesthesia Complications (+) PONV  Airway Mallampati: II  TM Distance: >3 FB Neck ROM: Full    Dental no notable dental hx.    Pulmonary neg pulmonary ROS,    Pulmonary exam normal breath sounds clear to auscultation       Cardiovascular hypertension, Pt. on medications Normal cardiovascular exam Rhythm:Regular Rate:Normal     Neuro/Psych negative neurological ROS  negative psych ROS   GI/Hepatic negative GI ROS, Neg liver ROS,   Endo/Other  negative endocrine ROS  Renal/GU negative Renal ROS  negative genitourinary   Musculoskeletal negative musculoskeletal ROS (+)   Abdominal   Peds negative pediatric ROS (+)  Hematology negative hematology ROS (+)   Anesthesia Other Findings   Reproductive/Obstetrics negative OB ROS                             Anesthesia Physical Anesthesia Plan  ASA: II  Anesthesia Plan: General   Post-op Pain Management:    Induction: Intravenous  PONV Risk Score and Plan: 4 or greater and Ondansetron, Dexamethasone, Midazolam, Scopolamine patch - Pre-op and Treatment may vary due to age or medical condition  Airway Management Planned: Oral ETT  Additional Equipment:   Intra-op Plan:   Post-operative Plan: Extubation in OR  Informed Consent: I have reviewed the patients History and Physical, chart, labs and discussed the procedure including the risks, benefits and alternatives for the proposed anesthesia with the patient or authorized representative who has indicated his/her understanding and acceptance.   Dental advisory given  Plan Discussed with: CRNA  Anesthesia Plan Comments: (Pt refuses SAB)        Anesthesia Quick Evaluation

## 2017-08-25 NOTE — Anesthesia Procedure Notes (Signed)
Procedure Name: Intubation Date/Time: 08/25/2017 3:45 PM Performed by: Genelle Bal, CRNA Pre-anesthesia Checklist: Patient identified, Emergency Drugs available, Suction available and Patient being monitored Patient Re-evaluated:Patient Re-evaluated prior to induction Oxygen Delivery Method: Circle system utilized Preoxygenation: Pre-oxygenation with 100% oxygen Induction Type: IV induction Ventilation: Mask ventilation without difficulty Laryngoscope Size: Miller and 2 Grade View: Grade I Tube type: Oral Tube size: 7.0 mm Number of attempts: 1 Airway Equipment and Method: Stylet Placement Confirmation: ETT inserted through vocal cords under direct vision,  positive ETCO2 and breath sounds checked- equal and bilateral Secured at: 20 cm Tube secured with: Tape Dental Injury: Teeth and Oropharynx as per pre-operative assessment

## 2017-08-25 NOTE — Transfer of Care (Signed)
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  Last Pain:  Vitals:   08/25/17 1334  TempSrc:   PainSc: 0-No pain      Patients Stated Pain Goal: 4 (79/03/83 3383)  Complications: No apparent anesthesia complications

## 2017-08-26 ENCOUNTER — Encounter (HOSPITAL_COMMUNITY): Payer: Self-pay | Admitting: Orthopaedic Surgery

## 2017-08-26 LAB — CBC
HCT: 32.2 % — ABNORMAL LOW (ref 36.0–46.0)
Hemoglobin: 11 g/dL — ABNORMAL LOW (ref 12.0–15.0)
MCH: 30.3 pg (ref 26.0–34.0)
MCHC: 34.2 g/dL (ref 30.0–36.0)
MCV: 88.7 fL (ref 78.0–100.0)
Platelets: 321 K/uL (ref 150–400)
RBC: 3.63 MIL/uL — ABNORMAL LOW (ref 3.87–5.11)
RDW: 13.6 % (ref 11.5–15.5)
WBC: 8.6 K/uL (ref 4.0–10.5)

## 2017-08-26 LAB — BASIC METABOLIC PANEL WITH GFR
Anion gap: 8 (ref 5–15)
BUN: 14 mg/dL (ref 8–23)
CO2: 26 mmol/L (ref 22–32)
Calcium: 8.9 mg/dL (ref 8.9–10.3)
Chloride: 102 mmol/L (ref 98–111)
Creatinine, Ser: 0.63 mg/dL (ref 0.44–1.00)
GFR calc Af Amer: >60 mL/min
GFR calc non Af Amer: >60 mL/min
Glucose, Bld: 157 mg/dL — ABNORMAL HIGH (ref 70–99)
Potassium: 4.4 mmol/L (ref 3.5–5.1)
Sodium: 136 mmol/L (ref 135–145)

## 2017-08-26 NOTE — Addendum Note (Signed)
Addendum  created 08/26/17 0646 by Lollie Sails, CRNA   Charge Capture section accepted

## 2017-08-26 NOTE — Progress Notes (Signed)
Subjective: 1 Day Post-Op Procedure(s) (LRB): RIGHT TOTAL HIP ARTHROPLASTY ANTERIOR APPROACH (Right) Patient reports pain as moderate.    Objective: Vital signs in last 24 hours: Temp:  [97.7 F (36.5 C)-98.7 F (37.1 C)] 98.3 F (36.8 C) (07/10 0315) Pulse Rate:  [53-86] 66 (07/10 0632) Resp:  [12-19] 18 (07/10 9458) BP: (102-166)/(55-92) 120/78 (07/10 5929) SpO2:  [95 %-99 %] 95 % (07/10 2446) Weight:  [158 lb 3 oz (71.8 kg)] 158 lb 3 oz (71.8 kg) (07/09 1334)  Intake/Output from previous day: 07/09 0701 - 07/10 0700 In: 2952.5 [P.O.:720; I.V.:2127.5; IV Piggyback:105] Out: 1150 [Urine:950; Blood:200] Intake/Output this shift: Total I/O In: 1477.5 [P.O.:600; I.V.:827.5; IV Piggyback:50] Out: 950 [Urine:950]  Recent Labs    08/26/17 0543  HGB 11.0*   Recent Labs    08/26/17 0543  WBC 8.6  RBC 3.63*  HCT 32.2*  PLT 321   Recent Labs    08/26/17 0543  NA 136  K 4.4  CL 102  CO2 26  BUN 14  CREATININE 0.63  GLUCOSE 157*  CALCIUM 8.9   No results for input(s): LABPT, INR in the last 72 hours.  Sensation intact distally Intact pulses distally Dorsiflexion/Plantar flexion intact Incision: dressing C/D/I    Assessment/Plan: 1 Day Post-Op Procedure(s) (LRB): RIGHT TOTAL HIP ARTHROPLASTY ANTERIOR APPROACH (Right) Up with therapy    Mcarthur Rossetti 08/26/2017, 6:56 AM

## 2017-08-26 NOTE — Evaluation (Signed)
Physical Therapy Evaluation Patient Details Name: Tammy Castro MRN: [id number] DOB: [birth year] Today's Date: 08/26/2017   History of Present Illness  Pt s/p R THR  Clinical Impression  Pt s/p R THR and presents with decreased R LE strength/ROM and post op pain limiting functional mobility.  Pt should progress to dc home with assist of friends/family.    Follow Up Recommendations Follow surgeon's recommendation for DC plan and follow-up therapies    Equipment Recommendations  3in1 (PT)    Recommendations for Other Services OT consult     Precautions / Restrictions Precautions Precautions: Fall Restrictions Weight Bearing Restrictions: No RLE Weight Bearing: Weight bearing as tolerated      Mobility  Bed Mobility Overal bed mobility: Needs Assistance Bed Mobility: Supine to Sit     Supine to sit: Min assist     General bed mobility comments: cues for sequence and use of R LE to self assist.    Transfers Overall transfer level: Needs assistance   Transfers: Sit to/from Stand Sit to Stand: Min assist;From elevated surface         General transfer comment: cues for LE management and use of UEs to self assist  Ambulation/Gait Ambulation/Gait assistance: Min assist Gait Distance (Feet): 70 Feet Assistive device: Rolling walker (2 wheeled) Gait Pattern/deviations: Step-to pattern;Decreased step length - right;Decreased step length - left;Shuffle;Trunk flexed Gait velocity: decr   General Gait Details: cues for sequence, posture and position from [employer]            Wheelchair Mobility    Modified Rankin (Stroke Patients Only)       Balance                                             Pertinent Vitals/Pain Pain Assessment: 0-10 Pain Score: 4  Pain Location: R hip Pain Descriptors / Indicators: Aching;Sore Pain Intervention(s): Limited activity within patient's tolerance;Monitored during session;Premedicated before  session;Ice applied    Home Living Family/patient expects to be discharged to:: Private residence Living Arrangements: Alone Available Help at Discharge: Friend(s);Available 24 hours/day Type of Home: House Home Access: Stairs to enter Entrance Stairs-Rails: Right Entrance Stairs-Number of Steps: 6 Home Layout: Able to live on main level with bedroom/bathroom Home Equipment: Walker - 2 wheels;Cane - single point      Prior Function Level of Independence: Independent               Hand Dominance        Extremity/Trunk Assessment   Upper Extremity Assessment Upper Extremity Assessment: Overall WFL for tasks assessed    Lower Extremity Assessment Lower Extremity Assessment: RLE deficits/detail RLE Deficits / Details: 2/5 strength at hip with AAROM at hip to 75 flex and 20 abd    Cervical / Trunk Assessment Cervical / Trunk Assessment: Normal  Communication   Communication: No difficulties  Cognition Arousal/Alertness: Awake/alert Behavior During Therapy: WFL for tasks assessed/performed Overall Cognitive Status: Within Functional Limits for tasks assessed                                        General Comments      Exercises Total Joint Exercises Ankle Circles/Pumps: AROM;Both;20 reps;Supine Quad Sets: AROM;Both;10 reps;Supine Heel Slides: AAROM;Right;20 reps;Supine Hip ABduction/ADduction: AAROM;Right;15 reps;Supine  Assessment/Plan    PT Assessment Patient needs continued PT services  PT Problem List Decreased strength;Decreased range of motion;Decreased activity tolerance;Decreased mobility;Decreased knowledge of use of DME;Pain       PT Treatment Interventions DME instruction;Gait training;Stair training;Functional mobility training;Therapeutic activities;Therapeutic exercise;Patient/family education    PT Goals (Current goals can be found in the Care Plan section)  Acute Rehab PT Goals Patient Stated Goal: Regain IND PT Goal  Formulation: With patient Time For Goal Achievement: 09/02/17 Potential to Achieve Goals: Good    Frequency 7X/week   Barriers to discharge        Co-evaluation               AM-PAC PT "6 Clicks" Daily Activity  Outcome Measure Difficulty turning over in bed (including adjusting bedclothes, sheets and blankets)?: Unable Difficulty moving from lying on back to sitting on the side of the bed? : Unable Difficulty sitting down on and standing up from a chair with arms (e.g., wheelchair, bedside commode, etc,.)?: Unable Help needed moving to and from a bed to chair (including a wheelchair)?: A Lot Help needed walking in hospital room?: A Little Help needed climbing 3-5 steps with a railing? : A Lot 6 Click Score: 10    End of Session Equipment Utilized During Treatment: Gait belt Activity Tolerance: Patient tolerated treatment well Patient left: in chair;with call bell/phone within reach;with chair alarm set Nurse Communication: Mobility status PT Visit Diagnosis: Difficulty in walking, not elsewhere classified (R26.2)    Time: 1035-1105 PT Time Calculation (min) (ACUTE ONLY): 30 min   Charges:   PT Evaluation $PT Eval Low Complexity: 1 Low PT Treatments $Therapeutic Exercise: 8-22 mins   PT G Codes:        Pg [phone]   Kaled Allende 08/26/2017, 12:38 PM

## 2017-08-26 NOTE — Evaluation (Signed)
Occupational Therapy Treatment Patient Details Name: Tammy Castro MRN: [id number] DOB: October 05, 1950 Today's Date: 08/26/2017    History of present illness Pt s/p R THR   OT comments  This 67 y/o female presents with the above. At baseline pt is independent with ADLs and functional mobility. Pt lives alone, reports she will have friends/family staying with her initially to provide 24hr supervision/assist PRN. Pt limited mostly by pain this session, but is motivated to progress with therapy towards PLOF. Pt currently requires minA for taking few steps within room and along EOB using RW. Requires minguard assist for seated UB ADL, modA for LB ADL. Began education regarding AE for LB ADLs. Pt will benefit from continued acute OT services prior to discharge to maximize her safety and independence with ADLs and mobility.    Follow Up Recommendations  Follow surgeon's recommendation for DC plan and follow-up therapies;Supervision/Assistance - 24 hour    Equipment Recommendations  3 in 1 bedside commode          Precautions / Restrictions Precautions Precautions: Fall Restrictions Weight Bearing Restrictions: No RLE Weight Bearing: Weight bearing as tolerated       Mobility Bed Mobility Overal bed mobility: Needs Assistance Bed Mobility: Supine to Sit;Sit to Supine     Supine to sit: Min assist Sit to supine: Min guard   General bed mobility comments: cues for sequence and use of L LE and UEs to self assist. MinGuard for safety when returning to supine  Transfers Overall transfer level: Needs assistance Equipment used: Rolling walker (2 wheeled) Transfers: Sit to/from Stand Sit to Stand: Min assist;From elevated surface         General transfer comment: cues for LE management and use of UEs to self assist                                               ADL either performed or assessed with clinical judgement   ADL Overall ADL's : Needs  assistance/impaired Eating/Feeding: Independent;Sitting   Grooming: Set up;Sitting   Upper Body Bathing: Min guard;Sitting   Lower Body Bathing: Sit to/from stand;Minimal assistance   Upper Body Dressing : Min guard;Sitting   Lower Body Dressing: Moderate assistance;Sit to/from stand Lower Body Dressing Details (indicate cue type and reason): educated on use of sock aide and reacher for completing LB dressing with pt return demonstrating with mod cues  Toilet Transfer: Minimal assistance;Stand-pivot;BSC;RW Toilet Transfer Details (indicate cue type and reason): simulated in transfer to/from EOB  Toileting- Clothing Manipulation and Hygiene: Minimal assistance;Sit to/from stand       Functional mobility during ADLs: Minimal assistance;Rolling walker General ADL Comments: pt limited by pain this session and with nausea prior to start of session (RN administered meds prior to session start); pt able to take few steps forward and back to EOB, sidesteps along EOB with overall minA using RW. Educated on use of AE for LB ADLs and AE handout issued                        Cognition Arousal/Alertness: Awake/alert Behavior During Therapy: WFL for tasks assessed/performed Overall Cognitive Status: Within Functional Limits for tasks assessed  General Comments      Pertinent Vitals/ Pain       Pain Assessment: Faces Faces Pain Scale: Hurts even more Pain Location: R hip Pain Descriptors / Indicators: Aching;Sore Pain Intervention(s): Monitored during session;Premedicated before session;Limited activity within patient's tolerance;Ice applied  Home Living Family/patient expects to be discharged to:: Private residence Living Arrangements: Alone Available Help at Discharge: Friend(s);Available 24 hours/day(24hr initially ) Type of Home: House Home Access: Stairs to enter [employer] of Steps:  6 Entrance Stairs-Rails: Right Home Layout: Able to live on main level with bedroom/bathroom     Bathroom Shower/Tub: Teacher, early years/pre: Standard     Home Equipment: Environmental consultant - 2 wheels;Cane - single point          Prior Functioning/Environment Level of Independence: Independent            Frequency  Min 2X/week        Progress Toward Goals  OT Goals(current goals can now be found in the care plan section)     Acute Rehab OT Goals Patient Stated Goal: Regain IND OT Goal Formulation: With patient Time For Goal Achievement: 09/09/17 Potential to Achieve Goals: Good  Plan      Co-evaluation                 AM-PAC PT "6 Clicks" Daily Activity     Outcome Measure   Help from another person eating meals?: None Help from another person taking care of personal grooming?: A Little Help from another person toileting, which includes using toliet, bedpan, or urinal?: A Lot Help from another person bathing (including washing, rinsing, drying)?: A Lot Help from another person to put on and taking off regular upper body clothing?: None Help from another person to put on and taking off regular lower body clothing?: A Lot 6 Click Score: 17    End of Session Equipment Utilized During Treatment: Gait belt;Rolling walker  OT Visit Diagnosis: Other abnormalities of gait and mobility (R26.89);Pain Pain - Right/Left: Right Pain - part of body: Hip   Activity Tolerance Patient tolerated treatment well;Patient limited by pain   Patient Left in bed;with call bell/phone within reach;with bed alarm set   Nurse Communication Mobility status        Time: 0814-4818 OT Time Calculation (min): 29 min  Charges: OT General Charges $OT Visit: 1 Visit OT Evaluation $OT Eval Low Complexity: 1 Low OT Treatments $Self Care/Home Management : 8-22 mins  Lou Cal, OT Pager [phone] 08/26/2017    Raymondo Band 08/26/2017, 4:22 PM

## 2017-08-26 NOTE — Plan of Care (Signed)
  Problem: Activity: Goal: Risk for activity intolerance will decrease Outcome: Progressing   Problem: Nutrition: Goal: Adequate nutrition will be maintained Outcome: Progressing   Problem: Coping: Goal: Level of anxiety will decrease Outcome: Progressing   Problem: Pain Managment: Goal: General experience of comfort will improve Outcome: Progressing   Problem: Safety: Goal: Ability to remain free from injury will improve Outcome: Progressing   

## 2017-08-27 MED ORDER — METHOCARBAMOL 500 MG PO TABS: 500.0000 mg | ORAL_TABLET | Freq: Four times a day (QID) | ORAL | 1 refills | Status: DC | PRN

## 2017-08-27 MED ORDER — OXYCODONE HCL 5 MG PO TABS: 5.0000 mg | ORAL_TABLET | ORAL | 0 refills | Status: DC | PRN

## 2017-08-27 MED ORDER — ASPIRIN 81 MG PO CHEW: 81.0000 mg | CHEWABLE_TABLET | Freq: Two times a day (BID) | ORAL | 0 refills | Status: DC

## 2017-08-27 NOTE — Progress Notes (Signed)
Occupational Therapy Treatment Patient Details Name: Tammy Castro MRN: [id number] DOB: 1950/06/05 Today's Date: 08/27/2017    History of present illness Pt s/p R THR   OT comments  Pt became nauseous when walking towards bathroom.  Able to don underwear without AE.  Used leg lifter for OOB  Follow Up Recommendations  Supervision/Assistance - 24 hour    Equipment Recommendations  3 in 1 bedside commode    Recommendations for Other Services      Precautions / Restrictions Precautions Precautions: Fall Restrictions Weight Bearing Restrictions: Yes RLE Weight Bearing: Weight bearing as tolerated       Mobility Bed Mobility         Supine to sit: Min guard     General bed mobility comments: used leg lifter for OOB; HOB raised 30 and extra time  Transfers   Equipment used: Rolling walker (2 wheeled)   Sit to Stand: Min guard         General transfer comment: for safety. Cues for UE/LE placement    Balance                                           ADL either performed or assessed with clinical judgement   ADL                       Lower Body Dressing: Min guard;Sit to/from stand(underwear only)   Toilet Transfer: Min guard;Ambulation;RW(chair)             General ADL Comments: only ambulated about 3 feet towards bathroom then c/o nausea.  Chair brought up, she rested then able to work on donning underwear. Pt states she wants to be able to take a shower. She is inching foot forward and likely will not be ready for this.  Demonstrated what she has to be able to do to step into shower.  Family can assist with socks:  she is wearing ted hose. Used leg lifter for OOB; will try belt or sheet to see if she can tolerate this     Vision       Perception     Praxis      Cognition Arousal/Alertness: Awake/alert Behavior During Therapy: WFL for tasks assessed/performed Overall Cognitive Status: Within Functional Limits for  tasks assessed                                          Exercises     Shoulder Instructions       General Comments      Pertinent Vitals/ Pain       Pain Assessment: 0-10 Pain Score: 3  Pain Location: R hip Pain Descriptors / Indicators: Aching;Sore Pain Intervention(s): Limited activity within patient's tolerance;Monitored during session;Premedicated before session;Repositioned  Home Living                                          Prior Functioning/Environment              Frequency  Min 2X/week        Progress Toward Goals  OT Goals(current goals can now be found in the care plan section)  Progress towards OT goals: Progressing toward goals     Plan      Co-evaluation                 AM-PAC PT "6 Clicks" Daily Activity     Outcome Measure   Help from another person eating meals?: None Help from another person taking care of personal grooming?: A Little Help from another person toileting, which includes using toliet, bedpan, or urinal?: A Little Help from another person bathing (including washing, rinsing, drying)?: A Little Help from another person to put on and taking off regular upper body clothing?: A Little Help from another person to put on and taking off regular lower body clothing?: A Lot 6 Click Score: 18    End of Session    OT Visit Diagnosis: Pain Pain - Right/Left: Right Pain - part of body: Hip   Activity Tolerance (limited by nausea when walking)   Patient Left in chair;with call bell/phone within reach;with chair alarm set;with nursing/sitter in room   Nurse Communication          Time: [phone] OT Time Calculation (min): 31 min  Charges: OT General Charges $OT Visit: 1 Visit OT Treatments $Self Care/Home Management : 23-37 mins  Lesle Chris, OTR/L [phone] 08/27/2017   [place] 08/27/2017, 10:20 AM

## 2017-08-27 NOTE — Progress Notes (Signed)
Physical Therapy Treatment Patient Details Name: Tammy Castro MRN: [id number] DOB: [birth year] Today's Date: 08/27/2017    History of Present Illness Pt s/p R THR    PT Comments    Pt continues to progress with mobility and activity tolerance.  Pt hopeful for return home tomorrow.   Follow Up Recommendations  Follow surgeon's recommendation for DC plan and follow-up therapies     Equipment Recommendations  3in1 (PT)    Recommendations for Other Services OT consult     Precautions / Restrictions Precautions Precautions: Fall Restrictions Weight Bearing Restrictions: No RLE Weight Bearing: Weight bearing as tolerated    Mobility  Bed Mobility Overal bed mobility: Needs Assistance Bed Mobility: Sit to Supine     Supine to sit: Min assist Sit to supine: Min guard   General bed mobility comments: cues for sequence and min quard with use of strap to assist R LE  Transfers Overall transfer level: Needs assistance Equipment used: Rolling walker (2 wheeled) Transfers: Sit to/from Stand Sit to Stand: Min guard         General transfer comment: for safety. Cues for UE/LE placement  Ambulation/Gait Ambulation/Gait assistance: Min guard Gait Distance (Feet): 100 Feet Assistive device: Rolling walker (2 wheeled) Gait Pattern/deviations: Decreased step length - right;Decreased step length - left;Shuffle;Trunk flexed;Step-to pattern;Step-through pattern Gait velocity: decr   General Gait Details: cues for sequence, posture and position from [employer] Mobility    Modified Rankin (Stroke Patients Only)       Balance                                            Cognition Arousal/Alertness: Awake/alert Behavior During Therapy: WFL for tasks assessed/performed Overall Cognitive Status: Within Functional Limits for tasks assessed                                        Exercises Total Joint  Exercises Ankle Circles/Pumps: AROM;Both;20 reps;Supine Quad Sets: AROM;Both;10 reps;Supine Heel Slides: AAROM;Right;20 reps;Supine Hip ABduction/ADduction: AAROM;Right;15 reps;Supine    General Comments        Pertinent Vitals/Pain Pain Assessment: 0-10 Pain Score: 3  Pain Location: R hip Pain Descriptors / Indicators: Aching;Sore Pain Intervention(s): Limited activity within patient's tolerance;Monitored during session;Premedicated before session;Ice applied    Home Living                      Prior Function            PT Goals (current goals can now be found in the care plan section) Acute Rehab PT Goals Patient Stated Goal: Regain IND PT Goal Formulation: With patient Time For Goal Achievement: 09/02/17 Potential to Achieve Goals: Good Progress towards PT goals: Progressing toward goals    Frequency    7X/week      PT Plan Current plan remains appropriate    Co-evaluation              AM-PAC PT "6 Clicks" Daily Activity  Outcome Measure  Difficulty turning over in bed (including adjusting bedclothes, sheets and blankets)?: A Lot Difficulty moving from lying on back to sitting on the side of the bed? : A Lot Difficulty sitting  down on and standing up from a chair with arms (e.g., wheelchair, bedside commode, etc,.)?: A Lot Help needed moving to and from a bed to chair (including a wheelchair)?: A Lot Help needed walking in hospital room?: A Little Help needed climbing 3-5 steps with a railing? : A Lot 6 Click Score: 13    End of Session Equipment Utilized During Treatment: Gait belt Activity Tolerance: Patient tolerated treatment well Patient left: in bed;with call bell/phone within reach;with family/visitor present Nurse Communication: Mobility status PT Visit Diagnosis: Difficulty in walking, not elsewhere classified (R26.2)     Time: 1497-0263 PT Time Calculation (min) (ACUTE ONLY): 20 min  Charges:  $Gait Training: 8-22  mins $Therapeutic Exercise: 8-22 mins                    G Codes:       ZC [phone]    Annaya Bangert 08/27/2017, 4:27 PM

## 2017-08-27 NOTE — Progress Notes (Signed)
Physical Therapy Treatment Patient Details Name: Tammy Castro MRN: [id number] DOB: [birth year] Today's Date: 08/27/2017    History of Present Illness Pt s/p R THR    PT Comments    Pt cooperative and progressing slowly with mobility.  Increased time required for all tasks but no c/o nausea this pm.   Follow Up Recommendations  Follow surgeon's recommendation for DC plan and follow-up therapies     Equipment Recommendations  3in1 (PT)    Recommendations for Other Services OT consult     Precautions / Restrictions Precautions Precautions: Fall Restrictions Weight Bearing Restrictions: No RLE Weight Bearing: Weight bearing as tolerated    Mobility  Bed Mobility Overal bed mobility: Needs Assistance Bed Mobility: Supine to Sit     Supine to sit: Min assist     General bed mobility comments: cues for sequence and use of L LE to self assist; physical assist with R LE  Transfers Overall transfer level: Needs assistance Equipment used: Rolling walker (2 wheeled) Transfers: Sit to/from Stand Sit to Stand: Min guard         General transfer comment: for safety. Cues for UE/LE placement  Ambulation/Gait Ambulation/Gait assistance: Min assist;Min guard Gait Distance (Feet): 70 Feet Assistive device: Rolling walker (2 wheeled) Gait Pattern/deviations: Decreased step length - right;Decreased step length - left;Shuffle;Trunk flexed;Step-to pattern;Step-through pattern Gait velocity: decr   General Gait Details: cues for sequence, posture and position from [employer] Mobility    Modified Rankin (Stroke Patients Only)       Balance                                            Cognition Arousal/Alertness: Awake/alert Behavior During Therapy: WFL for tasks assessed/performed Overall Cognitive Status: Within Functional Limits for tasks assessed                                         Exercises Total Joint Exercises Ankle Circles/Pumps: AROM;Both;20 reps;Supine Quad Sets: AROM;Both;10 reps;Supine Heel Slides: AAROM;Right;20 reps;Supine Hip ABduction/ADduction: AAROM;Right;15 reps;Supine    General Comments        Pertinent Vitals/Pain Pain Assessment: 0-10 Pain Score: 3  Pain Location: R hip Pain Descriptors / Indicators: Aching;Sore Pain Intervention(s): Limited activity within patient's tolerance;Monitored during session;Premedicated before session;Ice applied    Home Living                      Prior Function            PT Goals (current goals can now be found in the care plan section) Acute Rehab PT Goals Patient Stated Goal: Regain IND PT Goal Formulation: With patient Time For Goal Achievement: 09/02/17 Potential to Achieve Goals: Good Progress towards PT goals: Progressing toward goals    Frequency    7X/week      PT Plan Current plan remains appropriate    Co-evaluation              AM-PAC PT "6 Clicks" Daily Activity  Outcome Measure  Difficulty turning over in bed (including adjusting bedclothes, sheets and blankets)?: Unable Difficulty moving from lying on back to sitting on the side of the bed? : Unable  Difficulty sitting down on and standing up from a chair with arms (e.g., wheelchair, bedside commode, etc,.)?: Unable Help needed moving to and from a bed to chair (including a wheelchair)?: A Lot Help needed walking in hospital room?: A Little Help needed climbing 3-5 steps with a railing? : A Lot 6 Click Score: 10    End of Session Equipment Utilized During Treatment: Gait belt Activity Tolerance: Patient tolerated treatment well Patient left: in chair;with call bell/phone within reach;with chair alarm set Nurse Communication: Mobility status PT Visit Diagnosis: Difficulty in walking, not elsewhere classified (R26.2)     Time: 1428-1500 PT Time Calculation (min) (ACUTE ONLY): 32 min  Charges:  $Gait  Training: 8-22 mins $Therapeutic Exercise: 8-22 mins                    G Codes:       Pg [phone]    Rutha Melgoza 08/27/2017, 3:32 PM

## 2017-08-27 NOTE — Discharge Instructions (Signed)

## 2017-08-27 NOTE — Progress Notes (Signed)
PT Cancellation Note  Patient Details Name: CHIQUITTA MATTY MRN: [id number] DOB: March 21, 1950   Cancelled Treatment:     PT attempted x 2 but deferred 2* pt pain/nausea.  Will follow.   Nidal Rivet 08/27/2017, 12:41 PM

## 2017-08-27 NOTE — Progress Notes (Signed)
Subjective: 2 Days Post-Op Procedure(s) (LRB): RIGHT TOTAL HIP ARTHROPLASTY ANTERIOR APPROACH (Right) Patient reports pain as moderate to severe.  Feels she over did PT yesterday.   Objective: Vital signs in last 24 hours: Temp:  [98.4 F (36.9 C)-99 F (37.2 C)] 99 F (37.2 C) (07/11 0625) Pulse Rate:  [64-81] 81 (07/11 0625) Resp:  [16-20] 20 (07/11 0625) BP: (108-127)/(59-74) 127/59 (07/11 0625) SpO2:  [90 %-99 %] 90 % (07/11 0625)  Intake/Output from previous day: 07/10 0701 - 07/11 0700 In: 550 [P.O.:300; I.V.:250] Out: -  Intake/Output this shift: Total I/O In: 240 [P.O.:240] Out: -   Recent Labs    08/26/17 0543  HGB 11.0*   Recent Labs    08/26/17 0543  WBC 8.6  RBC 3.63*  HCT 32.2*  PLT 321   Recent Labs    08/26/17 0543  NA 136  K 4.4  CL 102  CO2 26  BUN 14  CREATININE 0.63  GLUCOSE 157*  CALCIUM 8.9   No results for input(s): LABPT, INR in the last 72 hours.  Right lower extremity: Sensation intact distally Intact pulses distally Dorsiflexion/Plantar flexion intact Incision: moderate drainage Compartment soft    Assessment/Plan: 2 Days Post-Op Procedure(s) (LRB): RIGHT TOTAL HIP ARTHROPLASTY ANTERIOR APPROACH (Right) Up with therapy  Plan discharge to home tomorrow.     GILBERT CLARK 08/27/2017, 9:30 AM

## 2017-08-28 NOTE — Progress Notes (Signed)
Physical Therapy Treatment Patient Details Name: Tammy Castro MRN: [id number] DOB: May 30, 1950 Today's Date: 08/28/2017    History of Present Illness Pt s/p R THR    PT Comments    Pt continues to progress steadily with mobility.  Reviewed therex, stairs and car transfers.  Son will be in this pm.   Follow Up Recommendations  Follow surgeon's recommendation for DC plan and follow-up therapies     Equipment Recommendations  3in1 (PT)    Recommendations for Other Services OT consult     Precautions / Restrictions Precautions Precautions: Fall Restrictions Weight Bearing Restrictions: No RLE Weight Bearing: Weight bearing as tolerated    Mobility  Bed Mobility               General bed mobility comments: Up with nursing  Transfers Overall transfer level: Needs assistance Equipment used: Rolling walker (2 wheeled) Transfers: Sit to/from Stand Sit to Stand: Min guard;Supervision         General transfer comment: for safety. Cues for UE/LE placement  Ambulation/Gait Ambulation/Gait assistance: Min guard Gait Distance (Feet): 100 Feet Assistive device: Rolling walker (2 wheeled) Gait Pattern/deviations: Decreased step length - right;Decreased step length - left;Shuffle;Trunk flexed;Step-to pattern;Step-through pattern Gait velocity: decr   General Gait Details: cues for sequence, posture and position from RW   Stairs Stairs: Yes Stairs assistance: Min assist Stair Management: One rail Right;Step to pattern;Forwards;With cane Number of Stairs: 5 General stair comments: cues for sequence and foot/cane placement   Wheelchair Mobility    Modified Rankin (Stroke Patients Only)       Balance                                            Cognition Arousal/Alertness: Awake/alert Behavior During Therapy: WFL for tasks assessed/performed Overall Cognitive Status: Within Functional Limits for tasks assessed                                         Exercises Total Joint Exercises Ankle Circles/Pumps: AROM;Both;20 reps;Supine Quad Sets: AROM;Both;10 reps;Supine Heel Slides: AAROM;Right;20 reps;Supine Hip ABduction/ADduction: AAROM;Right;15 reps;Supine Long Arc Quad: AROM;Right;10 reps;Seated    General Comments        Pertinent Vitals/Pain Pain Assessment: Faces Faces Pain Scale: Hurts a little bit Pain Location: R hip Pain Descriptors / Indicators: Sore Pain Intervention(s): Limited activity within patient's tolerance;Monitored during session;Premedicated before session;Ice applied    Home Living                      Prior Function            PT Goals (current goals can now be found in the care plan section) Acute Rehab PT Goals Patient Stated Goal: Regain IND PT Goal Formulation: With patient Time For Goal Achievement: 09/02/17 Potential to Achieve Goals: Good Progress towards PT goals: Progressing toward goals    Frequency    7X/week      PT Plan Current plan remains appropriate    Co-evaluation              AM-PAC PT "6 Clicks" Daily Activity  Outcome Measure  Difficulty turning over in bed (including adjusting bedclothes, sheets and blankets)?: A Lot Difficulty moving from lying on back to sitting on the side of  the bed? : A Lot Difficulty sitting down on and standing up from a chair with arms (e.g., wheelchair, bedside commode, etc,.)?: A Lot Help needed moving to and from a bed to chair (including a wheelchair)?: A Little Help needed walking in hospital room?: A Little Help needed climbing 3-5 steps with a railing? : A Little 6 Click Score: 15    End of Session Equipment Utilized During Treatment: Gait belt Activity Tolerance: Patient tolerated treatment well Patient left: in chair;with call bell/phone within reach Nurse Communication: Mobility status PT Visit Diagnosis: Difficulty in walking, not elsewhere classified (R26.2)     Time:  0915-1000 PT Time Calculation (min) (ACUTE ONLY): 45 min  Charges:  $Gait Training: 8-22 mins $Therapeutic Exercise: 8-22 mins $Therapeutic Activity: 8-22 mins                    G Codes:       Pg [phone]    Andrina Locken 08/28/2017, 12:18 PM

## 2017-08-28 NOTE — Progress Notes (Signed)
OT Treatment Note  Pt making good progress toward OT goals. Able to perform toilet transfer with supervision, UB dressing with set up, and LB dressing with light min assist. Educated pt on use of 3 in 1 in tub as a seat and proper transfer technique; would benefit from practice. D/c plan remains appropriate. Will continue to follow acutely.    08/28/17 1237  OT Visit Information  Last OT Received On 08/28/17  Assistance Needed +1  History of Present Illness Pt s/p R THR  Precautions  Precautions Fall  Pain Assessment  Pain Assessment Faces  Faces Pain Scale 2  Pain Location R hip  Pain Descriptors / Indicators Sore  Pain Intervention(s) Monitored during session;Ice applied  Cognition  Arousal/Alertness Awake/alert  Behavior During Therapy WFL for tasks assessed/performed  Overall Cognitive Status Within Functional Limits for tasks assessed  Upper Extremity Assessment  Upper Extremity Assessment Overall WFL for tasks assessed  Lower Extremity Assessment  Lower Extremity Assessment Defer to PT evaluation  ADL  Overall ADL's  Needs assistance/impaired  Grooming Supervision/safety;Standing;Wash/dry hands  Upper Body Dressing  Set up;Sitting  Lower Body Dressing Minimal assistance;Sit to/from stand  Lower Body Dressing Details (indicate cue type and reason) Assist to start R foot into pants  Toilet Transfer Supervision/safety;Ambulation;BSC;RW  Toileting- Technical sales engineer;Sit to/from Education officer, community Details (indicate cue type and reason) Educated pt on tub transfer technique with 3 in 1; pt verbalized understanding. Would benefit from practice.  Functional mobility during ADLs Supervision/safety;Rolling walker  Bed Mobility  General bed mobility comments Pt OOB in chair upon arrival  Balance  Overall balance assessment Needs assistance  Sitting-balance support Feet supported;No upper extremity supported  Sitting balance-Leahy Scale  Good  Standing balance support No upper extremity supported;During functional activity  Standing balance-Leahy Scale Fair  Restrictions  Weight Bearing Restrictions Yes  RLE Weight Bearing WBAT  Transfers  Overall transfer level Needs assistance  Equipment used Rolling walker (2 wheeled)  Transfers Sit to/from Stand  Sit to Stand Supervision  General transfer comment for safety, good hand placement and technique  OT - End of Session  Equipment Utilized During Treatment Rolling walker  Activity Tolerance Patient tolerated treatment well  Patient left in chair;with call bell/phone within reach  OT Assessment/Plan  OT Plan Discharge plan remains appropriate  OT Visit Diagnosis Pain  Pain - Right/Left Right  Pain - part of body Hip  OT Frequency (ACUTE ONLY) Min 2X/week  Follow Up Recommendations Supervision/Assistance - 24 hour;No OT follow up  OT Equipment 3 in 1 bedside commode  AM-PAC OT "6 Clicks" Daily Activity Outcome Measure  Help from another person eating meals? 4  Help from another person taking care of personal grooming? 3  Help from another person toileting, which includes using toliet, bedpan, or urinal? 3  Help from another person bathing (including washing, rinsing, drying)? 3  Help from another person to put on and taking off regular upper body clothing? 4  Help from another person to put on and taking off regular lower body clothing? 3  6 Click Score 20  ADL G Code Conversion CJ  OT Goal Progression  Progress towards OT goals Progressing toward goals  Acute Rehab OT Goals  Patient Stated Goal Regain IND  OT Goal Formulation With patient  OT Time Calculation  OT Start Time (ACUTE ONLY) 1125  OT Stop Time (ACUTE ONLY) 1148  OT Time Calculation (min) 23 min  OT General Charges  $OT Visit 1  Visit  OT Treatments  $Self Care/Home Management  23-37 mins   Lashanti Chambless A. Ulice Brilliant, M.S., OTR/L Acute Rehab Department: [phone]

## 2017-08-28 NOTE — Progress Notes (Signed)
Patient ID: [name], female   DOB: 09-10-1950, 67 y.o.   MRN: [id number] Doing well overall.  Can be discharged to home today.

## 2017-08-28 NOTE — Discharge Summary (Signed)
Patient ID: Tammy Castro MRN: [id number] DOB/AGE: 1950-05-04 67 y.o.  Admit date: 08/25/2017 Discharge date: 08/28/2017  Admission Diagnoses:  Principal Problem:   Unilateral primary osteoarthritis, right hip Active Problems:   Status post total replacement of right hip   Discharge Diagnoses:  Same  Past Medical History:  Diagnosis Date  . Arthritis   . Cancer (HCC)    Left nostril  . Complication of anesthesia   . Diverticulosis   . Osteoarthritis of hip    Right  . PONV (postoperative nausea and vomiting)    Can not remember the medicine used, but it was during the breast reduction surgery.  . Pre-diabetes   . Thyroid disease   . Toxemia in pregnancy   . Varicose veins of legs    surgery scheduled for  07/01/11    Surgeries: Procedure(s): RIGHT TOTAL HIP ARTHROPLASTY ANTERIOR APPROACH on 08/25/2017   Consultants:   Discharged Condition: Improved  Hospital Course: Tammy Castro is an 67 y.o. female who was admitted 08/25/2017 for operative treatment ofUnilateral primary osteoarthritis, right hip. Patient has severe unremitting pain that affects sleep, daily activities, and work/hobbies. After pre-op clearance the patient was taken to the operating room on 08/25/2017 and underwent  Procedure(s): RIGHT TOTAL HIP ARTHROPLASTY ANTERIOR APPROACH.    Patient was given perioperative antibiotics:  Anti-infectives (From admission, onward)   Start     Dose/Rate Route Frequency Ordered Stop   08/25/17 2200  ceFAZolin (ANCEF) IVPB 1 g/50 mL premix     1 g 100 mL/hr over 30 Minutes Intravenous Every 6 hours 08/25/17 1832 08/26/17 0430   08/25/17 1330  ceFAZolin (ANCEF) IVPB 2g/100 mL premix     2 g 200 mL/hr over 30 Minutes Intravenous On call to O.R. 08/25/17 1315 08/25/17 1615       Patient was given sequential compression devices, early ambulation, and chemoprophylaxis to prevent DVT.  Patient benefited maximally from hospital stay and there were no complications.     Recent vital signs:  Patient Vitals for the past 24 hrs:  BP Temp Temp src Pulse Resp SpO2  08/28/17 1306 (!) 123/58 98 F (36.7 C) Oral 82 15 97 %  08/28/17 1032 (!) 123/59 - - 83 - -  08/28/17 0634 (!) 112/59 99.2 F (37.3 C) Oral 80 16 95 %  08/27/17 2300 - 99 F (37.2 C) Oral - - -  08/27/17 2120 129/61 (!) 101.1 F (38.4 C) Oral 91 16 92 %     Recent laboratory studies:  Recent Labs    08/26/17 0543  WBC 8.6  HGB 11.0*  HCT 32.2*  PLT 321  NA 136  K 4.4  CL 102  CO2 26  BUN 14  CREATININE 0.63  GLUCOSE 157*  CALCIUM 8.9     Discharge Medications:   Allergies as of 08/28/2017   No Known Allergies     Medication List    TAKE these medications   aspirin 81 MG chewable tablet Chew 1 tablet (81 mg total) by mouth 2 (two) times daily.   calcium-vitamin D 500-200 MG-UNIT tablet Commonly known as:  OSCAL WITH D Take 1 tablet by mouth 2 (two) times daily.   COCONUT OIL PO Take 1 Scoop by mouth daily.   Collagen Hydrolysate Powd Take 1 Scoop by mouth daily.   COMPLETE MULTIVITAMIN/MINERAL PO Take 1 tablet by mouth once a day.   hydrochlorothiazide 12.5 MG tablet Commonly known as:  HYDRODIURIL Take 1 tablet (12.5 mg total) by mouth  daily. as directed   Lecithin 400 MG Caps Take 400 mg by mouth daily.   LIVER DEFENSE PO Take 1 tablet by mouth daily.   Magnesium 400 MG Caps Take 400 mg by mouth daily.   Melatonin 10 MG Caps Take 20 mg by mouth at bedtime.   methocarbamol 500 MG tablet Commonly known as:  ROBAXIN Take 1 tablet (500 mg total) by mouth every 6 (six) hours as needed for muscle spasms.   methylcellulose oral powder Take 1 packet by mouth daily.   Omega-3 & Omega-6 Fish Oil Caps Take 1 capsule by mouth 2 (two) times daily.   oxyCODONE 5 MG immediate release tablet Commonly known as:  Oxy IR/ROXICODONE Take 1-2 tablets (5-10 mg total) by mouth every 4 (four) hours as needed for moderate pain (pain score 4-6).   PROBIOTIC  ACIDOPHILUS PO Take 250 mg by mouth daily.   RESVERATROL PO Take 1 tablet by mouth daily.   vitamin E 1000 UNIT capsule Take 1,000 Units by mouth daily.   VITAMIN K (PHYTONADIONE) PO Take 1 tablet by mouth daily.            Durable Medical Equipment  (From admission, onward)        Start     Ordered   08/25/17 1833  DME 3 n 1  Once     08/25/17 1832   08/25/17 1833  DME Walker rolling  Once    Question:  Patient needs a walker to treat with the following condition  Answer:  Status post total replacement of right hip   08/25/17 1832      Diagnostic Studies: Dg Pelvis Portable  Result Date: 08/25/2017 CLINICAL DATA:  Status post right hip replacement EXAM: PORTABLE PELVIS 1-2 VIEWS COMPARISON:  None. FINDINGS: The patient is status post right hip replacement. Acetabular and femoral components are in good position. IMPRESSION: Right hip replacement as above. Electronically Signed   By: Dorise Bullion III M.D   On: 08/25/2017 19:43   Dg C-arm 1-60 Min  Result Date: 08/25/2017 CLINICAL DATA:  67 year old female post right hip replacement. Initial encounter. EXAM: DG C-ARM 61-120 MIN; OPERATIVE RIGHT HIP WITH PELVIS Fluoroscopic time: 26 seconds. COMPARISON:  08/05/2017 plain film exam. FINDINGS: Ten intraoperative C-arm views submitted for review after surgery. Placement right hip arthroplasty which appears in satisfactory position on frontal projection without complication noted. IMPRESSION: Post total right hip replacement. Electronically Signed   By: Genia Del M.D.   On: 08/25/2017 17:28   Dg Hip Operative Unilat W Or W/o Pelvis Right  Result Date: 08/25/2017 CLINICAL DATA:  67 year old female post right hip replacement. Initial encounter. EXAM: DG C-ARM 61-120 MIN; OPERATIVE RIGHT HIP WITH PELVIS Fluoroscopic time: 26 seconds. COMPARISON:  08/05/2017 plain film exam. FINDINGS: Ten intraoperative C-arm views submitted for review after surgery. Placement right hip arthroplasty  which appears in satisfactory position on frontal projection without complication noted. IMPRESSION: Post total right hip replacement. Electronically Signed   By: Genia Del M.D.   On: 08/25/2017 17:28   Xr Hip Unilat W Or W/o Pelvis 1v Right  Result Date: 08/05/2017 Standing AP pelvis and lateral right hip she has severe end-stage arthritis of the right hip left hip appears normal.  The right hip has significant joint space loss.  There is cystic change in the femoral head and periarticular osteophytes in both the acetabulum and the femoral head.   Disposition:  To skilled nursing facility  Discharge Instructions    Discharge  patient   Complete by:  As directed    Discharge disposition:  01-Home or Self Care   Discharge patient date:  08/28/2017      Follow-up Information    Mcarthur Rossetti, MD. Schedule an appointment as soon as possible for a visit in 2 week(s).   Specialty:  Orthopedic Surgery Contact information: [place] [address] [phone]            Signed: Mcarthur Rossetti 08/28/2017, 2:53 PM

## 2017-08-28 NOTE — Discharge Summary (Signed)
Patient ID: Tammy Castro MRN: [id number] DOB/AGE: 1950-12-18 66 y.o.  Admit date: 08/25/2017 Discharge date: 08/28/2017  Admission Diagnoses:  Principal Problem:   Unilateral primary osteoarthritis, right hip Active Problems:   Status post total replacement of right hip   Discharge Diagnoses:  Same  Past Medical History:  Diagnosis Date  . Arthritis   . Cancer (HCC)    Left nostril  . Complication of anesthesia   . Diverticulosis   . Osteoarthritis of hip    Right  . PONV (postoperative nausea and vomiting)    Can not remember the medicine used, but it was during the breast reduction surgery.  . Pre-diabetes   . Thyroid disease   . Toxemia in pregnancy   . Varicose veins of legs    surgery scheduled for  07/01/11    Surgeries: Procedure(s): RIGHT TOTAL HIP ARTHROPLASTY ANTERIOR APPROACH on 08/25/2017   Consultants:   Discharged Condition: Improved  Hospital Course: Tammy Castro is an 67 y.o. female who was admitted 08/25/2017 for operative treatment ofUnilateral primary osteoarthritis, right hip. Patient has severe unremitting pain that affects sleep, daily activities, and work/hobbies. After pre-op clearance the patient was taken to the operating room on 08/25/2017 and underwent  Procedure(s): RIGHT TOTAL HIP ARTHROPLASTY ANTERIOR APPROACH.    Patient was given perioperative antibiotics:  Anti-infectives (From admission, onward)   Start     Dose/Rate Route Frequency Ordered Stop   08/25/17 2200  ceFAZolin (ANCEF) IVPB 1 g/50 mL premix     1 g 100 mL/hr over 30 Minutes Intravenous Every 6 hours 08/25/17 1832 08/26/17 0430   08/25/17 1330  ceFAZolin (ANCEF) IVPB 2g/100 mL premix     2 g 200 mL/hr over 30 Minutes Intravenous On call to O.R. 08/25/17 1315 08/25/17 1615       Patient was given sequential compression devices, early ambulation, and chemoprophylaxis to prevent DVT.  Patient benefited maximally from hospital stay and there were no complications.     Recent vital signs:  Patient Vitals for the past 24 hrs:  BP Temp Temp src Pulse Resp SpO2  08/28/17 0634 (!) 112/59 99.2 F (37.3 C) Oral 80 16 95 %  08/27/17 2300 - 99 F (37.2 C) Oral - - -  08/27/17 2120 129/61 (!) 101.1 F (38.4 C) Oral 91 16 92 %  08/27/17 1303 117/60 98 F (36.7 C) Oral 75 12 97 %     Recent laboratory studies:  Recent Labs    08/26/17 0543  WBC 8.6  HGB 11.0*  HCT 32.2*  PLT 321  NA 136  K 4.4  CL 102  CO2 26  BUN 14  CREATININE 0.63  GLUCOSE 157*  CALCIUM 8.9     Discharge Medications:   Allergies as of 08/28/2017   No Known Allergies     Medication List    TAKE these medications   aspirin 81 MG chewable tablet Chew 1 tablet (81 mg total) by mouth 2 (two) times daily.   calcium-vitamin D 500-200 MG-UNIT tablet Commonly known as:  OSCAL WITH D Take 1 tablet by mouth 2 (two) times daily.   COCONUT OIL PO Take 1 Scoop by mouth daily.   Collagen Hydrolysate Powd Take 1 Scoop by mouth daily.   COMPLETE MULTIVITAMIN/MINERAL PO Take 1 tablet by mouth once a day.   hydrochlorothiazide 12.5 MG tablet Commonly known as:  HYDRODIURIL Take 1 tablet (12.5 mg total) by mouth daily. as directed   Lecithin 400 MG Caps Take 400  mg by mouth daily.   LIVER DEFENSE PO Take 1 tablet by mouth daily.   Magnesium 400 MG Caps Take 400 mg by mouth daily.   Melatonin 10 MG Caps Take 20 mg by mouth at bedtime.   methocarbamol 500 MG tablet Commonly known as:  ROBAXIN Take 1 tablet (500 mg total) by mouth every 6 (six) hours as needed for muscle spasms.   methylcellulose oral powder Take 1 packet by mouth daily.   Omega-3 & Omega-6 Fish Oil Caps Take 1 capsule by mouth 2 (two) times daily.   oxyCODONE 5 MG immediate release tablet Commonly known as:  Oxy IR/ROXICODONE Take 1-2 tablets (5-10 mg total) by mouth every 4 (four) hours as needed for moderate pain (pain score 4-6).   PROBIOTIC ACIDOPHILUS PO Take 250 mg by mouth daily.    RESVERATROL PO Take 1 tablet by mouth daily.   vitamin E 1000 UNIT capsule Take 1,000 Units by mouth daily.   VITAMIN K (PHYTONADIONE) PO Take 1 tablet by mouth daily.            Durable Medical Equipment  (From admission, onward)        Start     Ordered   08/25/17 1833  DME 3 n 1  Once     08/25/17 1832   08/25/17 1833  DME Walker rolling  Once    Question:  Patient needs a walker to treat with the following condition  Answer:  Status post total replacement of right hip   08/25/17 1832      Diagnostic Studies: Dg Pelvis Portable  Result Date: 08/25/2017 CLINICAL DATA:  Status post right hip replacement EXAM: PORTABLE PELVIS 1-2 VIEWS COMPARISON:  None. FINDINGS: The patient is status post right hip replacement. Acetabular and femoral components are in good position. IMPRESSION: Right hip replacement as above. Electronically Signed   By: Dorise Bullion III M.D   On: 08/25/2017 19:43   Dg C-arm 1-60 Min  Result Date: 08/25/2017 CLINICAL DATA:  66 year old female post right hip replacement. Initial encounter. EXAM: DG C-ARM 61-120 MIN; OPERATIVE RIGHT HIP WITH PELVIS Fluoroscopic time: 26 seconds. COMPARISON:  08/05/2017 plain film exam. FINDINGS: Ten intraoperative C-arm views submitted for review after surgery. Placement right hip arthroplasty which appears in satisfactory position on frontal projection without complication noted. IMPRESSION: Post total right hip replacement. Electronically Signed   By: Genia Del M.D.   On: 08/25/2017 17:28   Dg Hip Operative Unilat W Or W/o Pelvis Right  Result Date: 08/25/2017 CLINICAL DATA:  67 year old female post right hip replacement. Initial encounter. EXAM: DG C-ARM 61-120 MIN; OPERATIVE RIGHT HIP WITH PELVIS Fluoroscopic time: 26 seconds. COMPARISON:  08/05/2017 plain film exam. FINDINGS: Ten intraoperative C-arm views submitted for review after surgery. Placement right hip arthroplasty which appears in satisfactory position on  frontal projection without complication noted. IMPRESSION: Post total right hip replacement. Electronically Signed   By: Genia Del M.D.   On: 08/25/2017 17:28   Xr Hip Unilat W Or W/o Pelvis 1v Right  Result Date: 08/05/2017 Standing AP pelvis and lateral right hip she has severe end-stage arthritis of the right hip left hip appears normal.  The right hip has significant joint space loss.  There is cystic change in the femoral head and periarticular osteophytes in both the acetabulum and the femoral head.   Disposition: Discharge disposition: 01-Home or Self Care       Discharge Instructions    Discharge patient   Complete by:  As directed    Discharge disposition:  01-Home or Self Care   Discharge patient date:  08/28/2017      Follow-up Information    Mcarthur Rossetti, MD. Schedule an appointment as soon as possible for a visit in 2 week(s).   Specialty:  Orthopedic Surgery Contact information: [place] [id number] [phone]            Signed: Mcarthur Rossetti 08/28/2017, 7:00 AM

## 2017-08-28 NOTE — Clinical Social Work Note (Signed)
Clinical Social Work Assessment  Patient Details  Name: Tammy Castro MRN: [id number] Date of Birth: [birth year]  Date of referral:  08/28/17               Reason for consult:  Facility Placement, Discharge Planning                Permission sought to share information with:  Facility Art therapist granted to share information::  Yes, Verbal Permission Granted  Name::        Agency::  SNF  Relationship::  Son   Contact Information:     Housing/Transportation Living arrangements for the past 2 months:  Single Family Home Source of Information:  Patient Patient Interpreter Needed:  None Criminal Activity/Legal Involvement Pertinent to Current Situation/Hospitalization:  No - Comment as needed Significant Relationships:  Adult Children Lives with:  Self Do you feel safe going back to the place where you live?  Yes Need for family participation in patient care:  No (Coment)  Care giving concerns:   SNF placement for short rehab due to lack of assistance at home.   Social Worker assessment / plan:  CSW met with the patient at bedside to discuss discharge to SNF for rehab. CSW explain SNF process. Patient reports she is interested in a SNF-[facility] close to her home. CSW left a voicemail for the admission coordinator. CSW explain to patient [facility] is not available on the weekends therefore insurance authorization cannot be initiated until Monday.  Patient reports understanding.  CSW faxed patient clinicals to other SNF's in the area for additional options.    FL2 completed.  Plan: SNF Barriers to discharge: Ship broker.   Employment status:    Insurance informationEducational psychologist PT Recommendations:  [place] / Referral to community resources:  [place]  Patient/Family's Response to care: Agreeable and Responding well to care.   Patient/Family's Understanding of and Emotional Response  to Diagnosis, Current Treatment, and Prognosis:  Patient oriented x4. Patient has a good understanding of diagnosis and current treatment plan.   Emotional Assessment Appearance:  Developmentally appropriate Attitude/Demeanor/Rapport:    Affect (typically observed):  Accepting, Pleasant Orientation:  Oriented to Self, Oriented to Place, Oriented to  Time, Oriented to Situation Alcohol / Substance use:  Not Applicable Psych involvement (Current and /or in the community):  No (Comment)  Discharge Needs  Concerns to be addressed:  Discharge Planning Concerns Readmission within the last 30 days:  No Current discharge risk:  Dependent with Mobility Barriers to Discharge:  Continued Medical Work up, Insurance Authorization([employer] )   Lia Hopping, LCSW 08/28/2017, 3:41 PM

## 2017-08-28 NOTE — Progress Notes (Signed)
Patient ID: Tammy Castro, female   DOB: Jan 20, 1951, 67 y.o.   MRN: [id number] Due to lack of assistance at home, the patient is no requesting short-term skilled nursing placement.  Will have to consult social Work and hold on her discharge to home today.  Hopefully a disposition can happen soon.

## 2017-08-28 NOTE — Progress Notes (Signed)
Physical Therapy Treatment Patient Details Name: Tammy Castro MRN: [id number] DOB: Aug 12, 1950 Today's Date: 08/28/2017    History of Present Illness Pt s/p R THR    PT Comments    Pt continues to progress with mobility but home assist she had arranged is unable to provide level of support needed.  Pt would benefit from follow up rehab at SNF level to maximize IND and safety prior to return home with limited assistance.   Follow Up Recommendations  SNF     Equipment Recommendations  None recommended by PT    Recommendations for Other Services OT consult     Precautions / Restrictions Precautions Precautions: Fall Restrictions Weight Bearing Restrictions: No RLE Weight Bearing: Weight bearing as tolerated    Mobility  Bed Mobility Overal bed mobility: Needs Assistance Bed Mobility: Sit to Supine       Sit to supine: Min guard   General bed mobility comments: cues for sequence   Transfers Overall transfer level: Needs assistance Equipment used: Rolling walker (2 wheeled) Transfers: Sit to/from Stand Sit to Stand: Min guard         General transfer comment: for safety, good hand placement and technique  Ambulation/Gait Ambulation/Gait assistance: Min guard Gait Distance (Feet): 140 Feet Assistive device: Rolling walker (2 wheeled) Gait Pattern/deviations: Decreased step length - right;Decreased step length - left;Shuffle;Trunk flexed;Step-to pattern;Step-through pattern Gait velocity: decr   General Gait Details: cues for sequence, posture and position from [employer] Mobility    Modified Rankin (Stroke Patients Only)       Balance Overall balance assessment: Needs assistance Sitting-balance support: Feet supported;No upper extremity supported Sitting balance-Leahy Scale: Good     Standing balance support: No upper extremity supported;During functional activity Standing balance-Leahy Scale: Fair                               Cognition Arousal/Alertness: Awake/alert Behavior During Therapy: WFL for tasks assessed/performed Overall Cognitive Status: Within Functional Limits for tasks assessed                                        Exercises      General Comments        Pertinent Vitals/Pain Pain Assessment: 0-10 Pain Score: 4  Faces Pain Scale: Hurts a little bit Pain Location: R hip Pain Descriptors / Indicators: Aching;Sore Pain Intervention(s): Limited activity within patient's tolerance;Monitored during session;Premedicated before session;Ice applied    Home Living                      Prior Function            PT Goals (current goals can now be found in the care plan section) Acute Rehab PT Goals Patient Stated Goal: Regain IND PT Goal Formulation: With patient Time For Goal Achievement: 09/02/17 Potential to Achieve Goals: Good Progress towards PT goals: Progressing toward goals    Frequency    7X/week      PT Plan Current plan remains appropriate    Co-evaluation              AM-PAC PT "6 Clicks" Daily Activity  Outcome Measure  Difficulty turning over in bed (including adjusting bedclothes, sheets and blankets)?: A Lot Difficulty  moving from lying on back to sitting on the side of the bed? : A Lot Difficulty sitting down on and standing up from a chair with arms (e.g., wheelchair, bedside commode, etc,.)?: A Lot Help needed moving to and from a bed to chair (including a wheelchair)?: A Little Help needed walking in hospital room?: A Little Help needed climbing 3-5 steps with a railing? : A Little 6 Click Score: 15    End of Session Equipment Utilized During Treatment: Gait belt Activity Tolerance: Patient tolerated treatment well Patient left: in bed;with call bell/phone within reach;with family/visitor present Nurse Communication: Mobility status PT Visit Diagnosis: Difficulty in walking, not elsewhere  classified (R26.2)     Time: 4481-8563 PT Time Calculation (min) (ACUTE ONLY): 26 min  Charges:  $Gait Training: 23-37 mins                    G Codes:       [id number] [phone]    Latoia Eyster 08/28/2017, 4:36 PM

## 2017-08-28 NOTE — Care Management Important Message (Signed)
Important Message  Patient Details  Name: PERLA ECHAVARRIA MRN: [id number] Date of Birth: 07-15-1950   Medicare Important Message Given:  Yes    Kerin Salen 08/28/2017, 12:21 [place] Message  Patient Details  Name: KAVERI PERRAS MRN: [id number] Date of Birth: 1950-07-25   Medicare Important Message Given:  Yes    Kerin Salen 08/28/2017, 12:21 PM

## 2017-08-28 NOTE — NC FL2 (Addendum)
[place] LEVEL OF CARE SCREENING TOOL     IDENTIFICATION  Patient Name: Tammy Castro Birthdate: [birth year] Sex: female Admission Date (Current Location): 08/25/2017  [facility] and [place] Number:  Herbalist and Address:  [facility],  [address], [place]      Provider Number: [id number]  Attending Physician Name and Address:  Mcarthur Rossetti  Relative Name and Phone Number:       Current Level of Care: Hospital Recommended Level of Care: [place] Prior Approval Number:    Date Approved/Denied:   PASRR Number:   [id number]  Discharge Plan: SNF    Current Diagnoses: Patient Active Problem List   Diagnosis Date Noted  . Status post total replacement of right hip 08/25/2017  . Unilateral primary osteoarthritis, right hip 08/05/2017  . Palpitations 03/30/2017  . Screen for colon cancer 11/06/2016  . Tinnitus 06/20/2016  . Essential hypertension, benign 10/24/2015  . Mild vitamin D deficiency 07/31/2015  . Multiple actinic keratoses 02/21/2015  . Rotator cuff syndrome, left 09/08/2013  . Breast cancer screening 11/10/2011  . Personal history of colonic adenoma 04/21/2011  . Routine general medical examination at a health care facility 04/26/2010  . Hypothyroidism 04/16/2006  . HYPERCHOLESTEROLEMIA 04/16/2006  . DIVERTICULITIS OF COLON, NOS 04/16/2006  . OSTEOARTHRITIS OF SPINE, NOS 04/16/2006    Orientation RESPIRATION BLADDER Height & Weight     Self, Time, Situation, Place  Normal Continent Weight: 158 lb 3 oz (71.8 kg) Height:  5\' 4"  (162.6 cm)  BEHAVIORAL SYMPTOMS/MOOD NEUROLOGICAL BOWEL NUTRITION STATUS      Continent Diet  AMBULATORY STATUS COMMUNICATION OF NEEDS Skin   Extensive Assist Verbally Surgical wounds(Right Hip )                       Personal Care Assistance Level of Assistance  Bathing, Feeding, Dressing Bathing Assistance: Limited assistance Feeding  assistance: Independent Dressing Assistance: Limited assistance     Functional Limitations Info  Sight, Hearing, Speech Sight Info: Adequate Hearing Info: Adequate Speech Info: Adequate    SPECIAL CARE FACTORS FREQUENCY  PT (By licensed PT), OT (By licensed OT)     PT Frequency: 7x/week OT Frequency: 7x/week            Contractures Contractures Info: Not present    Additional Factors Info  Code Status, Allergies, Psychotropic Code Status Info: fullcode  Allergies Info: Allergies: No Known Allergies           Current Medications (08/28/2017):  This is the current hospital active medication list Current Facility-Administered Medications  Medication Dose Route Frequency Provider Last Rate Last Dose  . 0.9 %  sodium chloride infusion   Intravenous Continuous Mcarthur Rossetti, MD   Stopped at 08/26/17 1800  . acetaminophen (TYLENOL) tablet 325-650 mg  325-650 mg Oral Q6H PRN Mcarthur Rossetti, MD   650 mg at 08/27/17 2134  . alum & mag hydroxide-simeth (MAALOX/MYLANTA) 200-200-20 MG/5ML suspension 30 mL  30 mL Oral Q4H PRN Mcarthur Rossetti, MD      . aspirin chewable tablet 81 mg  81 mg Oral BID Mcarthur Rossetti, MD   81 mg at 08/28/17 1031  . calcium-vitamin D (OSCAL WITH D) 500-200 MG-UNIT per tablet 1 tablet  1 tablet Oral BID Mcarthur Rossetti, MD   1 tablet at 08/28/17 1034  . diphenhydrAMINE (BENADRYL) 12.5 MG/5ML elixir 12.5-25 mg  12.5-25 mg Oral Q4H PRN Ninfa Linden,  Lind Guest, MD   12.5 mg at 08/26/17 0059  . docusate sodium (COLACE) capsule 100 mg  100 mg Oral BID Mcarthur Rossetti, MD   100 mg at 08/28/17 1033  . hydrochlorothiazide (HYDRODIURIL) tablet 12.5 mg  12.5 mg Oral Daily Mcarthur Rossetti, MD   12.5 mg at 08/27/17 0959  . HYDROmorphone (DILAUDID) injection 0.5-1 mg  0.5-1 mg Intravenous Q4H PRN Mcarthur Rossetti, MD   1 mg at 08/26/17 1456  . magnesium hydroxide (MILK OF MAGNESIA) suspension 30 mL  30 mL  Oral Daily PRN Mcarthur Rossetti, MD      . menthol-cetylpyridinium (CEPACOL) lozenge 3 mg  1 lozenge Oral PRN Mcarthur Rossetti, MD       Or  . phenol (CHLORASEPTIC) mouth spray 1 spray  1 spray Mouth/Throat PRN Mcarthur Rossetti, MD      . methocarbamol (ROBAXIN) tablet 500 mg  500 mg Oral Q6H PRN Mcarthur Rossetti, MD   500 mg at 08/28/17 0534   Or  . methocarbamol (ROBAXIN) 500 mg in dextrose 5 % 50 mL IVPB  500 mg Intravenous Q6H PRN Mcarthur Rossetti, MD   Stopped at 08/25/17 1827  . metoCLOPramide (REGLAN) tablet 5-10 mg  5-10 mg Oral Q8H PRN Mcarthur Rossetti, MD       Or  . metoCLOPramide (REGLAN) injection 5-10 mg  5-10 mg Intravenous Q8H PRN Mcarthur Rossetti, MD   10 mg at 08/27/17 1529  . ondansetron (ZOFRAN) tablet 4 mg  4 mg Oral Q6H PRN Mcarthur Rossetti, MD       Or  . ondansetron [facility] injection 4 mg  4 mg Intravenous Q6H PRN Mcarthur Rossetti, MD   4 mg at 08/28/17 0856  . oxyCODONE (Oxy IR/ROXICODONE) immediate release tablet 10-15 mg  10-15 mg Oral Q4H PRN Mcarthur Rossetti, MD      . oxyCODONE (Oxy IR/ROXICODONE) immediate release tablet 5-10 mg  5-10 mg Oral Q4H PRN Mcarthur Rossetti, MD   5 mg at 08/28/17 0533  . pantoprazole (PROTONIX) EC tablet 40 mg  40 mg Oral Daily Mcarthur Rossetti, MD   40 mg at 08/28/17 1034  . vitamin E capsule 1,000 Units  1,000 Units Oral Daily Mcarthur Rossetti, MD   1,000 Units at 08/28/17 1032     Discharge Medications: Please see discharge summary for a list of discharge medications.  Relevant Imaging Results:  Relevant Lab Results:   Additional Information ssn: [id number]  Lia Hopping, LCSW

## 2017-08-29 MED ORDER — HYDROCORTISONE 2.5 % RE CREA
TOPICAL_CREAM | Freq: Four times a day (QID) | RECTAL | Status: DC | PRN
  Administered 2017-08-29 – 2017-08-30 (×3): via RECTAL
  Filled 2017-08-29: qty 28.35

## 2017-08-29 MED ORDER — BISACODYL 10 MG RE SUPP: 10.0000 mg | Freq: Once | RECTAL | Status: DC

## 2017-08-29 MED ORDER — MAGNESIUM CITRATE PO SOLN
1.0000 | Freq: Once | ORAL | Status: AC
  Administered 2017-08-29: 1 via ORAL

## 2017-08-29 NOTE — Progress Notes (Signed)
Physical Therapy Treatment Patient Details Name: Tammy Castro MRN: [id number] DOB: [birth year] Today's Date: 08/29/2017    History of Present Illness Pt s/p R THR    PT Comments    Marked improvement in activity tolerance and quality of movement.  Pain well controlled.   Follow Up Recommendations  SNF     Equipment Recommendations  None recommended by PT    Recommendations for Other Services OT consult     Precautions / Restrictions Precautions Precautions: Fall Restrictions Weight Bearing Restrictions: No RLE Weight Bearing: Weight bearing as tolerated    Mobility  Bed Mobility               General bed mobility comments: Pt up in chair and requests back to same  Transfers Overall transfer level: Needs assistance Equipment used: Rolling walker (2 wheeled) Transfers: Sit to/from Stand Sit to Stand: Min guard         General transfer comment: for safety, good hand placement and technique  Ambulation/Gait Ambulation/Gait assistance: Min guard;Supervision Gait Distance (Feet): 450 Feet Assistive device: Rolling walker (2 wheeled) Gait Pattern/deviations: Decreased step length - right;Decreased step length - left;Shuffle;Trunk flexed;Step-to pattern;Step-through pattern Gait velocity: decr   General Gait Details: cues for posture and position from [employer] Mobility    Modified Rankin (Stroke Patients Only)       Balance Overall balance assessment: Needs assistance Sitting-balance support: Feet supported;No upper extremity supported Sitting balance-Leahy Scale: Good     Standing balance support: No upper extremity supported;During functional activity Standing balance-Leahy Scale: Fair                              Cognition Arousal/Alertness: Awake/alert Behavior During Therapy: WFL for tasks assessed/performed Overall Cognitive Status: Within Functional Limits for tasks assessed                                         Exercises      General Comments        Pertinent Vitals/Pain Pain Assessment: 0-10 Pain Score: 3  Pain Location: R hip/thigh Pain Descriptors / Indicators: Aching;Sore;Tightness Pain Intervention(s): Limited activity within patient's tolerance;Monitored during session;Ice applied    Home Living                      Prior Function            PT Goals (current goals can now be found in the care plan section) Acute Rehab PT Goals Patient Stated Goal: Regain IND PT Goal Formulation: With patient Time For Goal Achievement: 09/02/17 Potential to Achieve Goals: Good Progress towards PT goals: Progressing toward goals    Frequency    7X/week      PT Plan Current plan remains appropriate    Co-evaluation              AM-PAC PT "6 Clicks" Daily Activity  Outcome Measure  Difficulty turning over in bed (including adjusting bedclothes, sheets and blankets)?: A Lot Difficulty moving from lying on back to sitting on the side of the bed? : A Lot Difficulty sitting down on and standing up from a chair with arms (e.g., wheelchair, bedside commode, etc,.)?: A Lot Help needed moving to and from a bed to  chair (including a wheelchair)?: A Little Help needed walking in hospital room?: A Little Help needed climbing 3-5 steps with a railing? : A Little 6 Click Score: 15    End of Session Equipment Utilized During Treatment: Gait belt Activity Tolerance: Patient tolerated treatment well Patient left: in chair;with call bell/phone within reach;with family/visitor present Nurse Communication: Mobility status PT Visit Diagnosis: Difficulty in walking, not elsewhere classified (R26.2)     Time: 3414-4360 PT Time Calculation (min) (ACUTE ONLY): 25 min  Charges:  $Gait Training: 23-37 mins                    G Codes:       Pg [phone]    Tammy Castro 08/29/2017, 4:38 PM

## 2017-08-29 NOTE — Progress Notes (Signed)
Patient ID: Tammy Castro, female   DOB: 1950/09/09, 67 y.o.   MRN: [id number] No acute changes.  Was going to go home yesterday, but discharge held due to need for skilled nursing.  She would like to go to a specific facility in [place].  Social Work has been consulted.  The plan for discharge is Monday.  Right hip is stable.  Vitals are stable.

## 2017-08-29 NOTE — Plan of Care (Signed)
Pt alert and oriented, anxious about being discharged today.  States she is ready to be discharged.  Pain well controlled. RN will monitor.

## 2017-08-30 NOTE — Progress Notes (Addendum)
During report this morning from the night shift RN [facility]), I was made aware of a large bruise on the pt's right posterior leg from below the buttock to the knee. The bruising has been marked and will continue to be monitored. In addition, I paged the on-call MD for [place] and shared my concern regarding the large amount of bruising. The on-call MD assured me that he will be here shortly and will assess the bruising.   On-call MD and PA saw pt at [phone] and reported no concerns regarding the pt's bruise on the posterior leg. Will continue to monitor. Pt reports no pain where the bruise is located.

## 2017-08-30 NOTE — Progress Notes (Signed)
Patient d/c'd home. All instructions given to patient and daughter, both verbalized understanding. All DME delivered. IV removed. Patient left the floor in stable condition

## 2017-08-30 NOTE — Progress Notes (Signed)
Physical Therapy Treatment Patient Details Name: Tammy Castro MRN: [id number] DOB: June 07, 1950 Today's Date: 08/30/2017    History of Present Illness Pt s/p R THR    PT Comments    Pt doing well; she feels ready to d/c home, dtr is coming to stay with her and assist prn; pt would like 3in1 for home, RN is aware; provided and reviewed HEP for later practice at home;    Follow Up Recommendations  Home health PT;Supervision - Intermittent     Equipment Recommendations  3in1 (PT)    Recommendations for Other Services       Precautions / Restrictions Precautions Precautions: Fall Restrictions Weight Bearing Restrictions: No RLE Weight Bearing: Weight bearing as tolerated    Mobility  Bed Mobility Overal bed mobility: Needs Assistance Bed Mobility: Supine to Sit     Supine to sit: Supervision;Min guard     General bed mobility comments: cues to self assist and not pull on therapist to come to sit  Transfers Overall transfer level: Needs assistance Equipment used: Rolling walker (2 wheeled) Transfers: Sit to/from Stand Sit to Stand: Supervision;Modified independent (Device/Increase time)         General transfer comment: for safety, good hand placement and technique  Ambulation/Gait Ambulation/Gait assistance: Supervision;Min guard Gait Distance (Feet): 240 Feet Assistive device: Rolling walker (2 wheeled) Gait Pattern/deviations: Step-through pattern;Decreased stride length     General Gait Details: cues for posture and position from [employer]         General stair comments: offerred to practice again, pt states she is confident and describes technique   Wheelchair Mobility    Modified Rankin (Stroke Patients Only)       Balance             Standing balance-Leahy Scale: Fair                              Cognition Arousal/Alertness: Awake/alert Behavior During Therapy: WFL for tasks assessed/performed Overall Cognitive  Status: Within Functional Limits for tasks assessed                                        Exercises Total Joint Exercises Ankle Circles/Pumps: AROM;Both;10 reps Quad Sets: AROM;Both;5 reps Heel Slides: AAROM;10 reps;Other (comment)(pt self assisted with gait belt looped over foor)    General Comments        Pertinent Vitals/Pain Pain Assessment: 0-10 Pain Score: 0-No pain    Home Living                      Prior Function            PT Goals (current goals can now be found in the care plan section) Acute Rehab PT Goals Patient Stated Goal: Regain IND PT Goal Formulation: With patient Time For Goal Achievement: 09/02/17 Potential to Achieve Goals: Good Progress towards PT goals: Progressing toward goals    Frequency    7X/week      PT Plan Current plan remains appropriate    Co-evaluation              AM-PAC PT "6 Clicks" Daily Activity  Outcome Measure  Difficulty turning over in bed (including adjusting bedclothes, sheets and blankets)?: A Little Difficulty moving from lying on back to sitting on the side of the  bed? : A Little Difficulty sitting down on and standing up from a chair with arms (e.g., wheelchair, bedside commode, etc,.)?: A Little Help needed moving to and from a bed to chair (including a wheelchair)?: A Little Help needed walking in hospital room?: A Little Help needed climbing 3-5 steps with a railing? : A Little 6 Click Score: 18    End of Session Equipment Utilized During Treatment: Gait belt Activity Tolerance: Patient tolerated treatment well Patient left: in chair;with call bell/phone within reach Nurse Communication: Other (comment)(ready fo rd/c RN aware) PT Visit Diagnosis: Difficulty in walking, not elsewhere classified (R26.2)     Time: 5361-4431 PT Time Calculation (min) (ACUTE ONLY): 18 min  Charges:  $Gait Training: 8-22 mins                    G CodesKenyon Ana,  PT Pager: [phone] 08/30/2017    [facility] 08/30/2017, 11:39 AM

## 2017-08-30 NOTE — Care Management Note (Signed)
Case Management Note  Patient Details  Name: Tammy Castro MRN: [id number] Date of Birth: October 02, 1950  Subjective/Objective:spoke to patient in rm-PT recc SNF-patient declines SNF-wants home w/HHC. PT- recc 3n1.Patient chose [facility] rep [facility]erm[place] rep able to accept,& aware of d/c today, HHPT, 3n1(to deliver 3n1 to rm prior d/c). Patient states her dtr will stay with her for support. No further CM needs.                   Action/Plan:d/c home w/HHC/dme.   Expected Discharge Date:  08/30/17               Expected Discharge Plan:  [place]  In-House Referral:     Discharge planning Services  CM Consult  Post Acute Care Choice:  (has rw) Choice offered to:  Patient  DME Arranged:  3-N-1 DME Agency:  [place]:  PT [place]:  [place]  Status of Service:  Completed, signed off  If discussed at [place] of Stay Meetings, dates discussed:    Additional Comments:  Dessa Phi, RN 08/30/2017, 11:57 AM

## 2017-08-30 NOTE — Progress Notes (Addendum)
Subjective: 5 Days Post-Op Procedure(s) (LRB): RIGHT TOTAL HIP ARTHROPLASTY ANTERIOR APPROACH (Right) Patient reports pain as mild.    Objective: Vital signs in last 24 hours: Temp:  [98.1 F (36.7 C)-99.1 F (37.3 C)] 99.1 F (37.3 C) (07/14 0505) Pulse Rate:  [76-88] 81 (07/14 1000) Resp:  [18] 18 (07/14 0505) BP: (107-133)/(62-72) 133/71 (07/14 1000) SpO2:  [94 %-98 %] 94 % (07/14 0505)  Intake/Output from previous day: 07/13 0701 - 07/14 0700 In: 960 [P.O.:960] Out: -  Intake/Output this shift: Total I/O In: 120 [P.O.:120] Out: -   No results for input(s): HGB in the last 72 hours. No results for input(s): WBC, RBC, HCT, PLT in the last 72 hours. No results for input(s): NA, K, CL, CO2, BUN, CREATININE, GLUCOSE, CALCIUM in the last 72 hours. No results for input(s): LABPT, INR in the last 72 hours.  Neurologically intact No cellulitis present Compartment soft Has had some diarrhea-has had suppository and colace-if no improvement in next 1-2 days contact physician Right hip dressing clean and dry Assessment/Plan: 5 Days Post-Op Procedure(s) (LRB): RIGHT TOTAL HIP ARTHROPLASTY ANTERIOR APPROACH (Right) Up with therapy Discharge home with home health    Garald Balding 08/30/2017, 10:23 AM

## 2017-08-31 ENCOUNTER — Telehealth (INDEPENDENT_AMBULATORY_CARE_PROVIDER_SITE_OTHER): Payer: Self-pay | Admitting: Orthopaedic Surgery

## 2017-08-31 NOTE — Telephone Encounter (Signed)
[facility] was assigned to patient last week but she was not discharged until Thursday, they contacted patient but she said someone else was assigned to her which looks like [place]. Tammy Castro, PT with [facility] just wants to verify which agency should be seeing her and make sure shes taken care of. Please call to advise  # [phone]

## 2017-09-01 DIAGNOSIS — Z96641 Presence of right artificial hip joint: Secondary | ICD-10-CM | POA: Diagnosis not present

## 2017-09-01 DIAGNOSIS — Z471 Aftercare following joint replacement surgery: Secondary | ICD-10-CM | POA: Diagnosis not present

## 2017-09-01 DIAGNOSIS — E039 Hypothyroidism, unspecified: Secondary | ICD-10-CM | POA: Diagnosis not present

## 2017-09-01 DIAGNOSIS — I10 Essential (primary) hypertension: Secondary | ICD-10-CM | POA: Diagnosis not present

## 2017-09-01 NOTE — Telephone Encounter (Signed)
Audelia Acton called again, said he tentatively scheduled patient at 10 a.m. As long as he got the OK. He does still have the order from last week so please contact him as soon as possible # [phone]

## 2017-09-01 NOTE — Telephone Encounter (Signed)
IC patient and she says that someone is coming at 1130 am today, she does not know which [place] agency it is.  I called Audelia Acton and s/w him, his appt was 10 am today.  He will call patient to tell her that he will now not be coming.  Per chart note patient chose [facility], that is who will be seeing her.  Patient also reports that she has had a terrible time getting her stomach to working again. I have advised her if she needs attention beyond OTC meds, to please let us know.  She understands.

## 2017-09-02 ENCOUNTER — Telehealth: Payer: Self-pay

## 2017-09-02 DIAGNOSIS — K649 Unspecified hemorrhoids: Secondary | ICD-10-CM

## 2017-09-02 MED ORDER — HYDROCORTISONE ACETATE 25 MG RE SUPP: 25.0000 mg | Freq: Two times a day (BID) | RECTAL | 0 refills | Status: DC

## 2017-09-02 MED ORDER — POLYETHYLENE GLYCOL 3350 17 GM/SCOOP PO POWD: ORAL | 1 refills | Status: DC

## 2017-09-02 NOTE — Telephone Encounter (Signed)
Offered appointment (I would look for thrombosed hemorhoid to I&D.) Patient declined.  I will send in Rxes which she agrees to.  She has stopped her narcotic pain medicine - good.

## 2017-09-02 NOTE — Addendum Note (Signed)
Addended by: Zenia Resides on: 09/02/2017 11:19 AM   Modules accepted: Orders

## 2017-09-02 NOTE — Telephone Encounter (Signed)
Pt called nurse line stating she is in a lot of pain and very uncomfortable from her hemorrhoids and mild constipation. Pt was discharged from hospital recently after total hip replacement. I offered patient an apt, "I can barely walk." Pt would like something called in for her to help. Please advise.

## 2017-09-04 ENCOUNTER — Telehealth: Payer: Self-pay | Admitting: Family Medicine

## 2017-09-04 DIAGNOSIS — Z96641 Presence of right artificial hip joint: Secondary | ICD-10-CM | POA: Diagnosis not present

## 2017-09-04 DIAGNOSIS — Z471 Aftercare following joint replacement surgery: Secondary | ICD-10-CM | POA: Diagnosis not present

## 2017-09-04 DIAGNOSIS — E039 Hypothyroidism, unspecified: Secondary | ICD-10-CM | POA: Diagnosis not present

## 2017-09-04 DIAGNOSIS — I10 Essential (primary) hypertension: Secondary | ICD-10-CM | POA: Diagnosis not present

## 2017-09-04 NOTE — Telephone Encounter (Signed)
Pt would like for Dr. Andria Frames to call her as soon as possible.

## 2017-09-07 DIAGNOSIS — E039 Hypothyroidism, unspecified: Secondary | ICD-10-CM | POA: Diagnosis not present

## 2017-09-07 DIAGNOSIS — I10 Essential (primary) hypertension: Secondary | ICD-10-CM | POA: Diagnosis not present

## 2017-09-07 DIAGNOSIS — Z471 Aftercare following joint replacement surgery: Secondary | ICD-10-CM | POA: Diagnosis not present

## 2017-09-07 DIAGNOSIS — Z96641 Presence of right artificial hip joint: Secondary | ICD-10-CM | POA: Diagnosis not present

## 2017-09-07 NOTE — Telephone Encounter (Signed)
I spoke with Tammy Castro.  She declined to talk with me.  She will await Dr Lowella Bandy return to talk with him.

## 2017-09-07 NOTE — Telephone Encounter (Signed)
Pt calling again for Dr. Andria Frames. Pt said she needs to speak with him this am. Ottis Stain, CMA

## 2017-09-08 ENCOUNTER — Encounter (INDEPENDENT_AMBULATORY_CARE_PROVIDER_SITE_OTHER): Payer: Self-pay | Admitting: Orthopaedic Surgery

## 2017-09-08 ENCOUNTER — Ambulatory Visit (INDEPENDENT_AMBULATORY_CARE_PROVIDER_SITE_OTHER): Payer: Medicare HMO | Admitting: Orthopaedic Surgery

## 2017-09-08 DIAGNOSIS — Z96641 Presence of right artificial hip joint: Secondary | ICD-10-CM

## 2017-09-08 NOTE — Progress Notes (Signed)
HPI: Tammy Castro returns today now 14 days status post right total hip arthroplasty.  She has multiple concerns with her postoperative care while in the hospital when that she feels that her constipation was not addressed properly.  Also she feels she had her care as far as physical therapy.  She also has some concerns about her home health therapy which she states started 5 days after she returned home. Patient's constipation is resolved.  She is getting home health PT.  Physical exam: Right hip surgical incisions healing well no signs of infection.  Small seroma.  Right calf supple nontender.  She ambulates with a cane.  Impression: Status post right total hip arthroplasty 14 days  Plan: Dressing was removed new Steri-Strips applied.  She is able to get the incision wet in shower.  See her back in a month for follow-up sooner if she has any concerns or estrogens.  Questions  and concerns were answered by Dr. Ninfa Linden and myself at length today.

## 2017-09-08 NOTE — Telephone Encounter (Signed)
Called and discussed.  Had some bleeding with her constipation and hemorrhoids.  Now improving.  Up to date on colon cancer screening.  No intervention by me at this time.

## 2017-09-09 DIAGNOSIS — E039 Hypothyroidism, unspecified: Secondary | ICD-10-CM | POA: Diagnosis not present

## 2017-09-09 DIAGNOSIS — I10 Essential (primary) hypertension: Secondary | ICD-10-CM | POA: Diagnosis not present

## 2017-09-09 DIAGNOSIS — Z96641 Presence of right artificial hip joint: Secondary | ICD-10-CM | POA: Diagnosis not present

## 2017-09-09 DIAGNOSIS — Z471 Aftercare following joint replacement surgery: Secondary | ICD-10-CM | POA: Diagnosis not present

## 2017-09-11 DIAGNOSIS — I10 Essential (primary) hypertension: Secondary | ICD-10-CM | POA: Diagnosis not present

## 2017-09-11 DIAGNOSIS — Z96641 Presence of right artificial hip joint: Secondary | ICD-10-CM | POA: Diagnosis not present

## 2017-09-11 DIAGNOSIS — Z471 Aftercare following joint replacement surgery: Secondary | ICD-10-CM | POA: Diagnosis not present

## 2017-09-11 DIAGNOSIS — E039 Hypothyroidism, unspecified: Secondary | ICD-10-CM | POA: Diagnosis not present

## 2017-09-14 DIAGNOSIS — Z471 Aftercare following joint replacement surgery: Secondary | ICD-10-CM | POA: Diagnosis not present

## 2017-09-14 DIAGNOSIS — I10 Essential (primary) hypertension: Secondary | ICD-10-CM | POA: Diagnosis not present

## 2017-09-14 DIAGNOSIS — Z96641 Presence of right artificial hip joint: Secondary | ICD-10-CM | POA: Diagnosis not present

## 2017-09-14 DIAGNOSIS — E039 Hypothyroidism, unspecified: Secondary | ICD-10-CM | POA: Diagnosis not present

## 2017-09-16 ENCOUNTER — Telehealth (INDEPENDENT_AMBULATORY_CARE_PROVIDER_SITE_OTHER): Payer: Self-pay

## 2017-09-16 DIAGNOSIS — Z96641 Presence of right artificial hip joint: Secondary | ICD-10-CM | POA: Diagnosis not present

## 2017-09-16 DIAGNOSIS — I10 Essential (primary) hypertension: Secondary | ICD-10-CM | POA: Diagnosis not present

## 2017-09-16 DIAGNOSIS — Z471 Aftercare following joint replacement surgery: Secondary | ICD-10-CM | POA: Diagnosis not present

## 2017-09-16 DIAGNOSIS — E039 Hypothyroidism, unspecified: Secondary | ICD-10-CM | POA: Diagnosis not present

## 2017-09-16 NOTE — Telephone Encounter (Signed)
error 

## 2017-09-16 NOTE — Telephone Encounter (Signed)
HHPT called stating that patient had some swelling that she was concerned about. I called patient and she described some swelling under incision that wasn't bothering her at al, no redness, no fever. I told her that this sounded like a seroma and to apply moist heat. I told her that if she experienced any redness, or fever to call me immediately! She voiced understanding

## 2017-09-18 DIAGNOSIS — E039 Hypothyroidism, unspecified: Secondary | ICD-10-CM | POA: Diagnosis not present

## 2017-09-18 DIAGNOSIS — Z471 Aftercare following joint replacement surgery: Secondary | ICD-10-CM | POA: Diagnosis not present

## 2017-09-18 DIAGNOSIS — I10 Essential (primary) hypertension: Secondary | ICD-10-CM | POA: Diagnosis not present

## 2017-09-18 DIAGNOSIS — Z96641 Presence of right artificial hip joint: Secondary | ICD-10-CM | POA: Diagnosis not present

## 2017-09-21 DIAGNOSIS — I10 Essential (primary) hypertension: Secondary | ICD-10-CM | POA: Diagnosis not present

## 2017-09-21 DIAGNOSIS — Z96641 Presence of right artificial hip joint: Secondary | ICD-10-CM | POA: Diagnosis not present

## 2017-09-21 DIAGNOSIS — Z471 Aftercare following joint replacement surgery: Secondary | ICD-10-CM | POA: Diagnosis not present

## 2017-09-21 DIAGNOSIS — E039 Hypothyroidism, unspecified: Secondary | ICD-10-CM | POA: Diagnosis not present

## 2017-09-25 DIAGNOSIS — Z96641 Presence of right artificial hip joint: Secondary | ICD-10-CM | POA: Diagnosis not present

## 2017-09-25 DIAGNOSIS — E039 Hypothyroidism, unspecified: Secondary | ICD-10-CM | POA: Diagnosis not present

## 2017-09-25 DIAGNOSIS — Z471 Aftercare following joint replacement surgery: Secondary | ICD-10-CM | POA: Diagnosis not present

## 2017-09-25 DIAGNOSIS — I10 Essential (primary) hypertension: Secondary | ICD-10-CM | POA: Diagnosis not present

## 2017-09-28 ENCOUNTER — Telehealth (INDEPENDENT_AMBULATORY_CARE_PROVIDER_SITE_OTHER): Payer: Self-pay | Admitting: Orthopaedic Surgery

## 2017-09-28 DIAGNOSIS — I10 Essential (primary) hypertension: Secondary | ICD-10-CM | POA: Diagnosis not present

## 2017-09-28 DIAGNOSIS — Z471 Aftercare following joint replacement surgery: Secondary | ICD-10-CM | POA: Diagnosis not present

## 2017-09-28 DIAGNOSIS — E039 Hypothyroidism, unspecified: Secondary | ICD-10-CM | POA: Diagnosis not present

## 2017-09-28 DIAGNOSIS — Z96641 Presence of right artificial hip joint: Secondary | ICD-10-CM | POA: Diagnosis not present

## 2017-09-28 NOTE — Telephone Encounter (Signed)
Leah, PT with [facility] left a vm to extend PT  2 x for 4 weeks Also, she said the mass under incision is still there but the incision itself looks good and healed. She said the mass is the size of a small lemon.   Call Leah with orders # [phone]

## 2017-10-01 ENCOUNTER — Ambulatory Visit (INDEPENDENT_AMBULATORY_CARE_PROVIDER_SITE_OTHER): Payer: Medicare HMO | Admitting: Family Medicine

## 2017-10-01 VITALS — Ht 65.0 in | Wt 155.6 lb

## 2017-10-01 DIAGNOSIS — E7800 Pure hypercholesterolemia, unspecified: Secondary | ICD-10-CM

## 2017-10-01 DIAGNOSIS — I10 Essential (primary) hypertension: Secondary | ICD-10-CM

## 2017-10-01 NOTE — Progress Notes (Signed)
Nutrition History  Usual eating pattern includes 4 meals per week and 4-5 snacks per day.  Frequent foods and beverages include chicken and raw veggies, coffee/water/2% milk.   Usual physical activity includes 130mn/wk exercise with trainer, Wednesday tai chi, gardening, walking dog.  24-hr recall: (Up at  AM) 6am B (9 AM)- water, 1 egg, 1/2c oatmeal w/ cinnamon, 1/2c 2% organic milk, coffee w/ coconut oil/collagen,     Snk ( AM)-  1/2 apple w/ peanut butter, water  L ( PM)-no lunch   Snk ( PM)-   D (3-4 PM)- 1serv chicken breast, 1c broc, 3c small salad mixed greens/spinach (oil, balsamic),  small baked potato   Snk ( PM)- 1.5c raisin bran cereal w/ 1c blueberries  Typical day? No.    Handouts given during visit include:  After-Visit Summary (AVS)  Demonstrated degree of understanding via:  Teach Back

## 2017-10-01 NOTE — Patient Instructions (Addendum)
   EDUCATION There are three types of fats: Saturated fats: mostly come from meat/dairy/eggs/coconut oil/palm oil Monounsaturated fats: avocados, extra virgin olive oil, canola oil, some nuts (Lower LDL and not lower HDL) *This is the best category* Polyunsaturated fat: nut/seed oil, soy oil, corn oil  (can lower LDL and HDL)  You can always eat more vegetables.   GOALS FOR THIS MONTH 1. Increase vegetable intake to at least 3 servings per day. 2. Have at least 1 meal per day instead of just snacking.  This should include a protein, a starch, and vegetables. 3. Goal for activity level is maintain progress with therapist after hip surgery and continue her baseline activity with gardening/dog walking/and tai chi  Talk to the front desk about an appt 10/10 at 10am

## 2017-10-02 DIAGNOSIS — Z471 Aftercare following joint replacement surgery: Secondary | ICD-10-CM | POA: Diagnosis not present

## 2017-10-02 DIAGNOSIS — I10 Essential (primary) hypertension: Secondary | ICD-10-CM | POA: Diagnosis not present

## 2017-10-02 DIAGNOSIS — E039 Hypothyroidism, unspecified: Secondary | ICD-10-CM | POA: Diagnosis not present

## 2017-10-02 DIAGNOSIS — Z96641 Presence of right artificial hip joint: Secondary | ICD-10-CM | POA: Diagnosis not present

## 2017-10-05 DIAGNOSIS — Z96641 Presence of right artificial hip joint: Secondary | ICD-10-CM | POA: Diagnosis not present

## 2017-10-05 DIAGNOSIS — E039 Hypothyroidism, unspecified: Secondary | ICD-10-CM | POA: Diagnosis not present

## 2017-10-05 DIAGNOSIS — Z471 Aftercare following joint replacement surgery: Secondary | ICD-10-CM | POA: Diagnosis not present

## 2017-10-05 DIAGNOSIS — I10 Essential (primary) hypertension: Secondary | ICD-10-CM | POA: Diagnosis not present

## 2017-10-06 ENCOUNTER — Encounter (INDEPENDENT_AMBULATORY_CARE_PROVIDER_SITE_OTHER): Payer: Self-pay | Admitting: Orthopaedic Surgery

## 2017-10-06 ENCOUNTER — Ambulatory Visit (INDEPENDENT_AMBULATORY_CARE_PROVIDER_SITE_OTHER): Payer: Medicare HMO | Admitting: Orthopaedic Surgery

## 2017-10-06 DIAGNOSIS — Z96641 Presence of right artificial hip joint: Secondary | ICD-10-CM

## 2017-10-06 NOTE — Progress Notes (Signed)
The patient comes in today now about 6 weeks status post right total hip arthroplasty.  She is doing well from hip standpoint.  She has still had significant constipation issues.  She is seeing her primary care physician for that now.  She started to get more regular.  She does have a ligament discrepancy with her right operative being longer than the left but apparently that was the way before surgery as well.  She is making progress at this point.  She still has significant stiffness with internal ex rotation of her right hip her incisions healed nicely.  She is slightly longer on the right than left.  She already has inserts for this.  At this standpoint she will continue increase her activities.  I apologized to her about the bedtime she had at the hospital in terms of nursing care and anesthesia care.  Hopefully her bowels will become under better control as time goes by.  We will see her back in 3 months.  At that visit I would like a standing low AP pelvis and lateral of her right operative hip.

## 2017-10-07 DIAGNOSIS — Z471 Aftercare following joint replacement surgery: Secondary | ICD-10-CM | POA: Diagnosis not present

## 2017-10-07 DIAGNOSIS — Z96641 Presence of right artificial hip joint: Secondary | ICD-10-CM | POA: Diagnosis not present

## 2017-10-07 DIAGNOSIS — E039 Hypothyroidism, unspecified: Secondary | ICD-10-CM | POA: Diagnosis not present

## 2017-10-07 DIAGNOSIS — I10 Essential (primary) hypertension: Secondary | ICD-10-CM | POA: Diagnosis not present

## 2017-10-12 DIAGNOSIS — E039 Hypothyroidism, unspecified: Secondary | ICD-10-CM | POA: Diagnosis not present

## 2017-10-12 DIAGNOSIS — I10 Essential (primary) hypertension: Secondary | ICD-10-CM | POA: Diagnosis not present

## 2017-10-12 DIAGNOSIS — Z96641 Presence of right artificial hip joint: Secondary | ICD-10-CM | POA: Diagnosis not present

## 2017-10-12 DIAGNOSIS — Z471 Aftercare following joint replacement surgery: Secondary | ICD-10-CM | POA: Diagnosis not present

## 2017-10-14 DIAGNOSIS — Z471 Aftercare following joint replacement surgery: Secondary | ICD-10-CM | POA: Diagnosis not present

## 2017-10-14 DIAGNOSIS — E039 Hypothyroidism, unspecified: Secondary | ICD-10-CM | POA: Diagnosis not present

## 2017-10-14 DIAGNOSIS — I10 Essential (primary) hypertension: Secondary | ICD-10-CM | POA: Diagnosis not present

## 2017-10-14 DIAGNOSIS — Z96641 Presence of right artificial hip joint: Secondary | ICD-10-CM | POA: Diagnosis not present

## 2017-10-19 DIAGNOSIS — I10 Essential (primary) hypertension: Secondary | ICD-10-CM | POA: Diagnosis not present

## 2017-10-19 DIAGNOSIS — E039 Hypothyroidism, unspecified: Secondary | ICD-10-CM | POA: Diagnosis not present

## 2017-10-19 DIAGNOSIS — Z471 Aftercare following joint replacement surgery: Secondary | ICD-10-CM | POA: Diagnosis not present

## 2017-10-19 DIAGNOSIS — Z96641 Presence of right artificial hip joint: Secondary | ICD-10-CM | POA: Diagnosis not present

## 2017-10-23 DIAGNOSIS — Z96641 Presence of right artificial hip joint: Secondary | ICD-10-CM | POA: Diagnosis not present

## 2017-10-23 DIAGNOSIS — Z471 Aftercare following joint replacement surgery: Secondary | ICD-10-CM | POA: Diagnosis not present

## 2017-10-23 DIAGNOSIS — E039 Hypothyroidism, unspecified: Secondary | ICD-10-CM | POA: Diagnosis not present

## 2017-10-23 DIAGNOSIS — I10 Essential (primary) hypertension: Secondary | ICD-10-CM | POA: Diagnosis not present

## 2017-10-26 DIAGNOSIS — I10 Essential (primary) hypertension: Secondary | ICD-10-CM | POA: Diagnosis not present

## 2017-10-26 DIAGNOSIS — Z96641 Presence of right artificial hip joint: Secondary | ICD-10-CM | POA: Diagnosis not present

## 2017-10-26 DIAGNOSIS — E039 Hypothyroidism, unspecified: Secondary | ICD-10-CM | POA: Diagnosis not present

## 2017-10-26 DIAGNOSIS — Z471 Aftercare following joint replacement surgery: Secondary | ICD-10-CM | POA: Diagnosis not present

## 2017-11-20 DIAGNOSIS — J069 Acute upper respiratory infection, unspecified: Secondary | ICD-10-CM | POA: Diagnosis not present

## 2017-11-22 DIAGNOSIS — B9789 Other viral agents as the cause of diseases classified elsewhere: Secondary | ICD-10-CM | POA: Diagnosis not present

## 2017-11-22 DIAGNOSIS — R918 Other nonspecific abnormal finding of lung field: Secondary | ICD-10-CM | POA: Diagnosis not present

## 2017-11-22 DIAGNOSIS — Z96641 Presence of right artificial hip joint: Secondary | ICD-10-CM | POA: Diagnosis not present

## 2017-11-22 DIAGNOSIS — R05 Cough: Secondary | ICD-10-CM | POA: Diagnosis not present

## 2017-11-22 DIAGNOSIS — J069 Acute upper respiratory infection, unspecified: Secondary | ICD-10-CM | POA: Diagnosis not present

## 2017-11-26 ENCOUNTER — Ambulatory Visit (INDEPENDENT_AMBULATORY_CARE_PROVIDER_SITE_OTHER): Payer: Medicare HMO | Admitting: Family Medicine

## 2017-11-26 ENCOUNTER — Encounter: Payer: Self-pay | Admitting: Family Medicine

## 2017-11-26 VITALS — Ht 65.0 in | Wt 159.8 lb

## 2017-11-26 DIAGNOSIS — E663 Overweight: Secondary | ICD-10-CM | POA: Diagnosis not present

## 2017-11-26 DIAGNOSIS — I10 Essential (primary) hypertension: Secondary | ICD-10-CM | POA: Diagnosis not present

## 2017-11-26 NOTE — Progress Notes (Signed)
  Assessment:  Primary concerns today: Weight management.   Learning Readiness:   Ready  Usual eating pattern includes 2 meals and 3 snacks per day. Frequent foods and beverages include apples, bananas, nuts. Water (fruits and vegetables), 2% milk, Coffee  Usual physical activity includes exercise class + rowing on Tuesday and Thursday. Tai chi on Wednesday. Gardening daily.    24-hr recall: (Up at  4:00 AM) B ( 9:00 AM)- One hard boiled egg, coffee 2% organic and yogurt (vanilla whole milk) 1 cup Snk ( AM)- None   L ( 2:00 PM)-Whole wheat toast (1), peanut butter crunchy, 1/2 apple with peanut butter, coffee+2%, and water 16 oz.  Snk ( PM)- Mixed nuts, water   D ( 7:00PM)-1 Potato, some butter Snk ( 9:30PM)- 1 apple  Typical day? No.    Handouts given during visit include:  AVS  For goals, see patient instructions under encounter.  Demonstrated degree of understanding via:  Teach Back  Barriers to learning/adherence to lifestyle change: None

## 2017-11-26 NOTE — Patient Instructions (Addendum)
Goals:  1. 3 meals a day with 2 snacks. Modified version of Dr.Oz plan.  Unlimited non starchy vegetables including day when you baby sit grand children. Include protein and some starch. 2. Continue silver sneakers 2 times a week, tai chi once a week and gardening daily. Patient taking swimming class. Increase rowing time by no more than 3 min a week. Write the number of minutes of rowing on a calendar as a way to track your activity level and help you with your goal. 3. Plan ahead, making sure meals are consistent/regular (no more than 5 hours during the day).

## 2017-12-09 ENCOUNTER — Telehealth: Payer: Self-pay | Admitting: Family Medicine

## 2017-12-09 NOTE — Telephone Encounter (Signed)
Most she wanted to talk about her son.  I listened - and could not say much because of confidentiality.  Her son is my patient and he will make an appointment to see me next week.

## 2017-12-09 NOTE — Telephone Encounter (Signed)
Pt is calling and would like to speak to Dr. Andria Frames concerning herself and son. She did not want to go into detail other it was 50/50. jw

## 2017-12-24 ENCOUNTER — Ambulatory Visit: Payer: Medicare HMO | Admitting: Family Medicine

## 2018-01-04 ENCOUNTER — Ambulatory Visit (INDEPENDENT_AMBULATORY_CARE_PROVIDER_SITE_OTHER): Payer: Medicare HMO | Admitting: Orthopaedic Surgery

## 2018-03-16 ENCOUNTER — Ambulatory Visit (INDEPENDENT_AMBULATORY_CARE_PROVIDER_SITE_OTHER): Payer: Medicare HMO | Admitting: Orthopaedic Surgery

## 2018-03-16 ENCOUNTER — Encounter (INDEPENDENT_AMBULATORY_CARE_PROVIDER_SITE_OTHER): Payer: Self-pay | Admitting: Orthopaedic Surgery

## 2018-03-16 ENCOUNTER — Ambulatory Visit (INDEPENDENT_AMBULATORY_CARE_PROVIDER_SITE_OTHER): Payer: Medicare HMO

## 2018-03-16 DIAGNOSIS — Z96641 Presence of right artificial hip joint: Secondary | ICD-10-CM | POA: Diagnosis not present

## 2018-03-16 NOTE — Progress Notes (Signed)
Office Visit Note   Patient: Tammy Castro           Date of Birth: 03-07-1950           MRN: [id number] Visit Date: 03/16/2018              Requested by: Zenia Resides, MD [address] [place], [place] [address] PCP: Zenia Resides, MD   Assessment & Plan: Visit Diagnoses:  1. History of right hip replacement     Plan: I do feel that it is worth trying outpatient physical therapy to work on aggressive stretching and motion as well as strengthening of her right hip without hip precautions.  I gave her prescription for outpatient physical therapy.  We will see her back in 2 months to see how she is doing overall but no x-rays are needed.  I did explain what heterotopic ossification means and not sure if she truly understands this at all.  I do not feel that a surgical intervention is warranted at this point without trying time and physical therapy as well since is only been 7 months postop.  Follow-Up Instructions: Return in about 2 months (around 05/15/2018).   Orders:  Orders Placed This Encounter  Procedures  . XR HIP UNILAT W OR W/O PELVIS 1V RIGHT   No orders of the defined types were placed in this encounter.     Procedures: No procedures performed   Clinical Data: No additional findings.   Subjective: Chief Complaint  Patient presents with  . Right Hip - Pain  The patient is now 7 months status post a right total hip arthroplasty.  She feels like her range of motion and stiffness in her right hip is not where she wants to be yet.  She is requesting outpatient physical therapy.  She has developed some left hip pain but not in her groin.  She otherwise is been doing well walking without assistive device.  She feels uneven in her gait.  HPI  Review of Systems She currently denies any headache, chest pain, shortness of breath, fever, chills, nausea, vomiting  Objective: Vital Signs: There were no vitals taken for this visit.  Physical Exam She  is alert orient x3 and in no acute distress Ortho Exam Examination of her left hip shows fluid internal and external rotation with no pain around the left hip at all.  Examination of her right hip does show stiffness with flexion as well as rotation. Specialty Comments:  No specialty comments available.  Imaging: Xr Hip Unilat W Or W/o Pelvis 1v Right  Result Date: 03/16/2018 An AP pelvis and lateral of the right operative hip shows a total hip arthroplasty with no complicating features.  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    PMFS History: Patient Active Problem List   Diagnosis Date Noted  . Hemorrhoid 09/02/2017  . Status post total replacement of right hip 08/25/2017  . Unilateral primary osteoarthritis, right hip 08/05/2017  . Palpitations 03/30/2017  . Screen for colon cancer 11/06/2016  . Tinnitus 06/20/2016  . Essential hypertension, benign 10/24/2015  . Mild vitamin D deficiency 07/31/2015  . Multiple actinic keratoses 02/21/2015  . Rotator cuff syndrome, left 09/08/2013  . Breast cancer screening 11/10/2011  . Personal history of colonic adenoma 04/21/2011  . Routine general medical examination at a health care facility 04/26/2010  . Hypothyroidism 04/16/2006  . HYPERCHOLESTEROLEMIA 04/16/2006  . DIVERTICULITIS OF COLON, NOS 04/16/2006  .  OSTEOARTHRITIS OF SPINE, NOS 04/16/2006   Past Medical History:  Diagnosis Date  . Arthritis   . Cancer (HCC)    Left nostril  . Complication of anesthesia   . Diverticulosis   . Osteoarthritis of hip    Right  . PONV (postoperative nausea and vomiting)    Can not remember the medicine used, but it was during the breast reduction surgery.  . Pre-diabetes   . Thyroid disease   . Toxemia in pregnancy   . Varicose veins of legs    surgery scheduled for  07/01/11    Family History  Problem Relation Age of Onset  . Colon polyps Mother   . Cancer Mother   . Alzheimer's  disease Father   . Parkinson's disease Brother   . Alcohol abuse Sister   . Alcohol abuse Brother   . Alcohol abuse Brother     Past Surgical History:  Procedure Laterality Date  . APPENDECTOMY    . BREAST BIOPSY Right   . BREAST REDUCTION SURGERY Bilateral   . COLON SURGERY    . COLONOSCOPY    . INGUINAL HERNIA REPAIR     right side  . KNEE ARTHROSCOPY     Left  . REDUCTION MAMMAPLASTY    . Ruptured colon     In 1995  . THYROID SURGERY     right side  . TOTAL HIP ARTHROPLASTY Right 08/25/2017   Procedure: RIGHT TOTAL HIP ARTHROPLASTY ANTERIOR APPROACH;  Surgeon: Mcarthur Rossetti, MD;  Location: [facility];  Service: Orthopedics;  Laterality: Right;   Social History   Occupational History  . Not on file  Tobacco Use  . Smoking status: Never Smoker  . Smokeless tobacco: Never Used  Substance and Sexual Activity  . Alcohol use: No  . Drug use: No  . Sexual activity: Not Currently

## 2018-03-18 ENCOUNTER — Telehealth (INDEPENDENT_AMBULATORY_CARE_PROVIDER_SITE_OTHER): Payer: Self-pay | Admitting: Orthopaedic Surgery

## 2018-03-18 NOTE — Telephone Encounter (Signed)
Per her request mailed to her home address

## 2018-03-18 NOTE — Telephone Encounter (Signed)
Pt called in asking if she can get a copy of her xray front and back

## 2018-03-25 DIAGNOSIS — R9431 Abnormal electrocardiogram [ECG] [EKG]: Secondary | ICD-10-CM | POA: Diagnosis not present

## 2018-03-25 DIAGNOSIS — R0789 Other chest pain: Secondary | ICD-10-CM | POA: Diagnosis not present

## 2018-03-25 DIAGNOSIS — Z79899 Other long term (current) drug therapy: Secondary | ICD-10-CM | POA: Diagnosis not present

## 2018-03-25 DIAGNOSIS — R0602 Shortness of breath: Secondary | ICD-10-CM | POA: Diagnosis not present

## 2018-03-25 MED ORDER — SODIUM CHLORIDE 0.9 % IV SOLN
10.00 | INTRAVENOUS | Status: DC
Start: ? — End: 2018-03-25

## 2018-03-25 MED ORDER — GENERIC EXTERNAL MEDICATION
10.00 | Status: DC
Start: ? — End: 2018-03-25

## 2018-03-26 ENCOUNTER — Telehealth: Payer: Self-pay | Admitting: Family Medicine

## 2018-03-26 DIAGNOSIS — R079 Chest pain, unspecified: Secondary | ICD-10-CM

## 2018-03-26 NOTE — Telephone Encounter (Signed)
Pt called and wanted  to have Dr. Andria Frames to send a Stress Order to have Stress Test done at the [facility]. Please call pt [phone].  ad

## 2018-03-31 ENCOUNTER — Telehealth: Payer: Self-pay | Admitting: Family Medicine

## 2018-03-31 DIAGNOSIS — R079 Chest pain, unspecified: Secondary | ICD-10-CM | POA: Insufficient documentation

## 2018-03-31 NOTE — Telephone Encounter (Signed)
Pt called just to return the call from Dr. Andria Frames. ad

## 2018-03-31 NOTE — Assessment & Plan Note (Signed)
Episode of CP seen in ER.  Does need stress test. Also, Admits to major stresss in life, which may be contributing.  She will see me after she gets checked out by cardiologist.

## 2018-03-31 NOTE — Telephone Encounter (Signed)
See other phone note.  I handled.

## 2018-04-20 ENCOUNTER — Telehealth (INDEPENDENT_AMBULATORY_CARE_PROVIDER_SITE_OTHER): Payer: Self-pay

## 2018-04-20 NOTE — Telephone Encounter (Signed)
Faxed order to provided fax number.

## 2018-04-20 NOTE — Telephone Encounter (Signed)
[facility] would like to know if an updated order for PT could be faxed to [phone] for patient's appointment on 05/03/2018.  Cb# is [phone].  Please advise.  Thank You.

## 2018-04-28 NOTE — Progress Notes (Deleted)
Tammy Solders, MD Reason for referral-chest pain  HPI: 69 year old female for evaluation of chest pain at request of Madison Hickman, MD.  Current Outpatient Medications  Medication Sig Dispense Refill  . calcium-vitamin D (OSCAL WITH D 500-200) 500-200 MG-UNIT per tablet Take 1 tablet by mouth 2 (two) times daily.      . COCONUT OIL PO Take 1 Scoop by mouth daily.    . Collagen Hydrolysate POWD Take 1 Scoop by mouth daily.    . hydrochlorothiazide (HYDRODIURIL) 12.5 MG tablet Take 1 tablet (12.5 mg total) by mouth daily. as directed 90 tablet 3  . Lactobacillus (PROBIOTIC ACIDOPHILUS PO) Take 250 mg by mouth daily.    . Lecithin 400 MG CAPS Take 400 mg by mouth daily.    . Magnesium 400 MG CAPS Take 400 mg by mouth daily.     . Melatonin 10 MG CAPS Take 20 mg by mouth at bedtime.     . methocarbamol (ROBAXIN) 500 MG tablet Take 1 tablet (500 mg total) by mouth every 6 (six) hours as needed for muscle spasms. 40 tablet 1  . Multiple Vitamins-Minerals (COMPLETE MULTIVITAMIN/MINERAL PO) Take 1 tablet by mouth once a day.     . Nutritional Supplements (LIVER DEFENSE PO) Take 1 tablet by mouth daily.     . Omega 3-6-9 Fatty Acids (OMEGA-3 & OMEGA-6 FISH OIL) CAPS Take 1 capsule by mouth 2 (two) times daily.    . vitamin E 1000 UNIT capsule Take 1,000 Units by mouth daily.    Marland Kitchen VITAMIN K, PHYTONADIONE, PO Take 1 tablet by mouth daily.      No current facility-administered medications for this visit.     No Known Allergies  Past Medical History:  Diagnosis Date  . Arthritis   . Cancer (HCC)    Left nostril  . Complication of anesthesia   . Diverticulosis   . Osteoarthritis of hip    Right  . PONV (postoperative nausea and vomiting)    Can not remember the medicine used, but it was during the breast reduction surgery.  . Pre-diabetes   . Thyroid disease   . Toxemia in pregnancy   . Varicose veins of legs    surgery scheduled for  07/01/11    Past Surgical  History:  Procedure Laterality Date  . APPENDECTOMY    . BREAST BIOPSY Right   . BREAST REDUCTION SURGERY Bilateral   . COLON SURGERY    . COLONOSCOPY    . INGUINAL HERNIA REPAIR     right side  . KNEE ARTHROSCOPY     Left  . REDUCTION MAMMAPLASTY    . Ruptured colon     In 1995  . THYROID SURGERY     right side  . TOTAL HIP ARTHROPLASTY Right 08/25/2017   Procedure: RIGHT TOTAL HIP ARTHROPLASTY ANTERIOR APPROACH;  Surgeon: Mcarthur Rossetti, MD;  Location: [facility];  Service: Orthopedics;  Laterality: Right;    Social History   Socioeconomic History  . Marital status: Widowed    Spouse name: Not on file  . Number of children: Not on file  . Years of education: Not on file  . Highest education level: Not on file  Occupational History  . Not on file  Social Needs  . Financial resource strain: Not on file  . Food insecurity:    Worry: Not on file    Inability: Not on file  . Transportation needs:    Medical: Not on file  Non-medical: Not on file  Tobacco Use  . Smoking status: Never Smoker  . Smokeless tobacco: Never Used  Substance and Sexual Activity  . Alcohol use: No  . Drug use: No  . Sexual activity: Not Currently  Lifestyle  . Physical activity:    Days per week: Not on file    Minutes per session: Not on file  . Stress: Not on file  Relationships  . Social connections:    Talks on phone: Not on file    Gets together: Not on file    Attends religious service: Not on file    Active member of club or organization: Not on file    Attends meetings of clubs or organizations: Not on file    Relationship status: Not on file  . Intimate partner violence:    Fear of current or ex partner: Not on file    Emotionally abused: Not on file    Physically abused: Not on file    Forced sexual activity: Not on file  Other Topics Concern  . Not on file  Social History Narrative  . Not on file    Family History  Problem Relation Age of Onset  . Colon polyps  Mother   . Cancer Mother   . Alzheimer's disease Father   . Parkinson's disease Brother   . Alcohol abuse Sister   . Alcohol abuse Brother   . Alcohol abuse Brother     ROS: no fevers or chills, productive cough, hemoptysis, dysphasia, odynophagia, melena, hematochezia, dysuria, hematuria, rash, seizure activity, orthopnea, PND, pedal edema, claudication. Remaining systems are negative.  Physical Exam:   There were no vitals taken for this visit.  General:  Well developed/well nourished in NAD Skin warm/dry Patient not depressed No peripheral clubbing Back-normal HEENT-normal/normal eyelids Neck supple/normal carotid upstroke bilaterally; no bruits; no JVD; no thyromegaly chest - CTA/ normal expansion CV - RRR/normal S1 and S2; no murmurs, rubs or gallops;  PMI nondisplaced Abdomen -NT/ND, no HSM, no mass, + bowel sounds, no bruit 2+ femoral pulses, no bruits Ext-no edema, chords, 2+ DP Neuro-grossly nonfocal  ECG - personally reviewed  A/P  1  Kirk Ruths, MD

## 2018-05-05 ENCOUNTER — Ambulatory Visit: Payer: Medicare HMO | Admitting: Cardiology

## 2018-07-14 ENCOUNTER — Other Ambulatory Visit: Payer: Self-pay | Admitting: Family Medicine

## 2018-07-14 DIAGNOSIS — Z1231 Encounter for screening mammogram for malignant neoplasm of breast: Secondary | ICD-10-CM

## 2018-07-21 ENCOUNTER — Other Ambulatory Visit: Payer: Self-pay

## 2018-07-21 ENCOUNTER — Ambulatory Visit (INDEPENDENT_AMBULATORY_CARE_PROVIDER_SITE_OTHER): Payer: Medicare HMO

## 2018-07-21 DIAGNOSIS — Z1231 Encounter for screening mammogram for malignant neoplasm of breast: Secondary | ICD-10-CM

## 2018-08-16 ENCOUNTER — Telehealth: Payer: Self-pay | Admitting: *Deleted

## 2018-08-16 DIAGNOSIS — Z780 Asymptomatic menopausal state: Secondary | ICD-10-CM

## 2018-08-16 NOTE — Telephone Encounter (Signed)
Pt thinks that she is due for a bone density.  She would like to have this done @ [facility] in [place] like last time. Christen Bame, CMA

## 2018-08-17 DIAGNOSIS — Z780 Asymptomatic menopausal state: Secondary | ICD-10-CM | POA: Insufficient documentation

## 2018-08-17 NOTE — Telephone Encounter (Signed)
Pt informed of below.Tammy Castro, CMA ? ?

## 2018-08-17 NOTE — Telephone Encounter (Signed)
Order entered as requested

## 2018-08-17 NOTE — Assessment & Plan Note (Signed)
3 years since last bone density.

## 2018-08-25 ENCOUNTER — Ambulatory Visit (INDEPENDENT_AMBULATORY_CARE_PROVIDER_SITE_OTHER): Payer: Medicare HMO

## 2018-08-25 ENCOUNTER — Other Ambulatory Visit: Payer: Self-pay

## 2018-08-25 DIAGNOSIS — Z780 Asymptomatic menopausal state: Secondary | ICD-10-CM | POA: Diagnosis not present

## 2018-08-31 ENCOUNTER — Encounter: Payer: Self-pay | Admitting: Family Medicine

## 2018-08-31 DIAGNOSIS — M8580 Other specified disorders of bone density and structure, unspecified site: Secondary | ICD-10-CM | POA: Insufficient documentation

## 2018-09-13 DIAGNOSIS — I788 Other diseases of capillaries: Secondary | ICD-10-CM | POA: Diagnosis not present

## 2018-09-16 ENCOUNTER — Ambulatory Visit (INDEPENDENT_AMBULATORY_CARE_PROVIDER_SITE_OTHER): Payer: Medicare HMO | Admitting: Family Medicine

## 2018-09-16 ENCOUNTER — Other Ambulatory Visit: Payer: Self-pay

## 2018-09-16 ENCOUNTER — Encounter: Payer: Self-pay | Admitting: Family Medicine

## 2018-09-16 DIAGNOSIS — Z0000 Encounter for general adult medical examination without abnormal findings: Secondary | ICD-10-CM | POA: Diagnosis not present

## 2018-09-16 DIAGNOSIS — Z96641 Presence of right artificial hip joint: Secondary | ICD-10-CM

## 2018-09-16 DIAGNOSIS — E038 Other specified hypothyroidism: Secondary | ICD-10-CM

## 2018-09-16 DIAGNOSIS — I10 Essential (primary) hypertension: Secondary | ICD-10-CM

## 2018-09-16 DIAGNOSIS — Z86010 Personal history of colonic polyps: Secondary | ICD-10-CM

## 2018-09-16 DIAGNOSIS — F4321 Adjustment disorder with depressed mood: Secondary | ICD-10-CM | POA: Insufficient documentation

## 2018-09-16 NOTE — Assessment & Plan Note (Signed)
She has some pain and limited motion of right hip.  Needs PT.  Will do after COVID dies down.

## 2018-09-16 NOTE — Progress Notes (Signed)
Established Patient Office Visit  Subjective:  Patient ID: Tammy Castro, female    DOB: 1950/07/19  Age: 68 y.o. MRN: [id number]  CC:  Chief Complaint  Patient presents with  . Annual Exam    HPI Tammy Castro presents for Began as an annual exam and morphed into a counseling session. 68 yo female who has been quite healthy.  Main medical problem is hypertension.  She stopped her HCTZ on her own.  She is determined to control her health issues with diet, exercise and herbal supplements.  Has an herbalist and takes many OTC medication but no prescription medications.  Brings in home BP readings which range systolics between 476-546 and diastolics between 50-35.  > 80% of readings are below 140/90 Weight is up 10 lbs and not exercising as much.  Her gym closed down with COVID.  Slhe has just started a regular walking regimen. HPDP up to date on screening tests. Social - and here is where the visit turned.  She has a good relationship with her daughter and her two grandchildren.  Unfortunately, the daughter's husband is "an alcoholic" which creates considerable strain.  Even worse, her relationship with her son has always been strained, especially since the death of her husband/his father seven years ago.  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  He has had a series of relationships with women who are dependant.  He also fathered a child with whom he has little contact.  She has been critical of these relationships and their relationship has steadily declined. She has recently found a counselor who has helped her a great deal.  She has a "no-medication" bias.  She feels she is in a much better place than two months ago due to her counselor.  No SI or HI.  Past Medical History:  Diagnosis Date  . Arthritis   . Cancer (HCC)    Left nostril  . Complication of anesthesia   . Diverticulosis   . Osteoarthritis of hip    Right  . PONV (postoperative nausea and vomiting)    Can  not remember the medicine used, but it was during the breast reduction surgery.  . Pre-diabetes   . Thyroid disease   . Toxemia in pregnancy   . Varicose veins of legs    surgery scheduled for  07/01/11    Past Surgical History:  Procedure Laterality Date  . APPENDECTOMY    . BREAST BIOPSY Right   . BREAST REDUCTION SURGERY Bilateral   . COLON SURGERY    . COLONOSCOPY    . INGUINAL HERNIA REPAIR     right side  . KNEE ARTHROSCOPY     Left  . REDUCTION MAMMAPLASTY    . Ruptured colon     In 1995  . THYROID SURGERY     right side  . TOTAL HIP ARTHROPLASTY Right 08/25/2017   Procedure: RIGHT TOTAL HIP ARTHROPLASTY ANTERIOR APPROACH;  Surgeon: Mcarthur Rossetti, MD;  Location: [facility];  Service: Orthopedics;  Laterality: Right;    Family History  Problem Relation Age of Onset  . Colon polyps Mother   . Cancer Mother   . Alzheimer's disease Father   . Parkinson's disease Brother   . Alcohol abuse Sister   . Alcohol abuse Brother   . Alcohol abuse Brother     Social History   Socioeconomic History  . Marital status: Widowed    Spouse name: Not on file  . Number of  children: Not on file  . Years of education: Not on file  . Highest education level: Not on file  Occupational History  . Not on file  Social Needs  . Financial resource strain: Not on file  . Food insecurity    Worry: Not on file    Inability: Not on file  . Transportation needs    Medical: Not on file    Non-medical: Not on file  Tobacco Use  . Smoking status: Never Smoker  . Smokeless tobacco: Never Used  Substance and Sexual Activity  . Alcohol use: No  . Drug use: No  . Sexual activity: Not Currently  Lifestyle  . Physical activity    Days per week: Not on file    Minutes per session: Not on file  . Stress: Not on file  Relationships  . Social Herbalist on phone: Not on file    Gets together: Not on file    Attends religious service: Not on file    Active member of club  or organization: Not on file    Attends meetings of clubs or organizations: Not on file    Relationship status: Not on file  . Intimate partner violence    Fear of current or ex partner: Not on file    Emotionally abused: Not on file    Physically abused: Not on file    Forced sexual activity: Not on file  Other Topics Concern  . Not on file  Social History Narrative  . Not on file    Outpatient Medications Prior to Visit  Medication Sig Dispense Refill  . calcium-vitamin D (OSCAL WITH D 500-200) 500-200 MG-UNIT per tablet Take 1 tablet by mouth 2 (two) times daily.      . COCONUT OIL PO Take 1 Scoop by mouth daily.    . Collagen Hydrolysate POWD Take 1 Scoop by mouth daily.    . Lactobacillus (PROBIOTIC ACIDOPHILUS PO) Take 250 mg by mouth daily.    . Lecithin 400 MG CAPS Take 400 mg by mouth daily.    . Magnesium 400 MG CAPS Take 400 mg by mouth daily.     . Melatonin 10 MG CAPS Take 20 mg by mouth at bedtime.     . Multiple Vitamins-Minerals (COMPLETE MULTIVITAMIN/MINERAL PO) Take 1 tablet by mouth once a day.     . Nutritional Supplements (LIVER DEFENSE PO) Take 1 tablet by mouth daily.     . Omega 3-6-9 Fatty Acids (OMEGA-3 & OMEGA-6 FISH OIL) CAPS Take 1 capsule by mouth 2 (two) times daily.    . vitamin E 1000 UNIT capsule Take 1,000 Units by mouth daily.    Marland Kitchen VITAMIN K, PHYTONADIONE, PO Take 1 tablet by mouth daily.     . hydrochlorothiazide (HYDRODIURIL) 12.5 MG tablet Take 1 tablet (12.5 mg total) by mouth daily. as directed 90 tablet 3  . methocarbamol (ROBAXIN) 500 MG tablet Take 1 tablet (500 mg total) by mouth every 6 (six) hours as needed for muscle spasms. 40 tablet 1   No facility-administered medications prior to visit.     No Known Allergies  ROS Review of Systems   Denies CP, DOE, focal neuro weakness, new headaches, worrisome skin lesions and bleeding.  Denies change in appetite, bowel or bladder.   Objective:    Physical Exam  BP 135/73   Pulse 75    Temp 98.1 F (36.7 C) (Oral)   Ht 5' 4"  (1.626 m)  Wt 165 lb (74.8 kg)   SpO2 96%   BMI 28.32 kg/m  Wt Readings from Last 3 Encounters:  09/16/18 165 lb (74.8 kg)  11/26/17 159 lb 12.8 oz (72.5 kg)  10/01/17 155 lb 9.6 oz (70.6 kg)  HEENT WNL Neck supple Lungs clear Cardiac RRR without m or g Abd benign Ext WNL Neuro motor and sensory grossly intact.  Gait and cognition normal.   There are no preventive care reminders to display for this patient.  There are no preventive care reminders to display for this patient.  Lab Results  Component Value Date   TSH 0.520 03/26/2017   Lab Results  Component Value Date   WBC 8.6 08/26/2017   HGB 11.0 (L) 08/26/2017   HCT 32.2 (L) 08/26/2017   MCV 88.7 08/26/2017   PLT 321 08/26/2017   Lab Results  Component Value Date   NA 136 08/26/2017   K 4.4 08/26/2017   CO2 26 08/26/2017   GLUCOSE 157 (H) 08/26/2017   BUN 14 08/26/2017   CREATININE 0.63 08/26/2017   BILITOT 0.5 03/26/2017   ALKPHOS 71 03/26/2017   AST 31 03/26/2017   ALT 33 (H) 03/26/2017   PROT 7.1 03/26/2017   ALBUMIN 4.6 03/26/2017   CALCIUM 8.9 08/26/2017   ANIONGAP 8 08/26/2017   Lab Results  Component Value Date   CHOL 237 (H) 03/26/2017   Lab Results  Component Value Date   HDL 73 03/26/2017   Lab Results  Component Value Date   LDLCALC 143 (H) 03/26/2017   Lab Results  Component Value Date   TRIG 107 03/26/2017   Lab Results  Component Value Date   CHOLHDL 3.2 03/26/2017   No results found for: HGBA1C    Assessment & Plan:   Problem List Items Addressed This Visit    Essential hypertension, benign   Relevant Orders   CBC   TSH   CMP14+EGFR      No orders of the defined types were placed in this encounter.   Follow-up: No follow-ups on file.    Zenia Resides, MD

## 2018-09-16 NOTE — Assessment & Plan Note (Signed)
She was on 5y recall per GI, but last colonoscopy was normal.  We jointly decided that she would put this off at least anotehr year.

## 2018-09-16 NOTE — Assessment & Plan Note (Signed)
Control seems adaquate with diet alone.

## 2018-09-16 NOTE — Patient Instructions (Addendum)
Keep up seeing your therapist.  She sounds like she is helping you a lot. No colonoscopy this year. I will call with the blood test results. When the coronavirus dies down, we need to get physical therapy for that hip.   See me in two months.

## 2018-09-16 NOTE — Assessment & Plan Note (Signed)
Healthy female whose main problem is emotional stress.

## 2018-09-16 NOTE — Assessment & Plan Note (Signed)
Continue with counseling.  I will follow in 2 months to reassess.

## 2018-09-16 NOTE — Assessment & Plan Note (Signed)
Not on meds.  Will check TSH.  If normal remove this problem from her problem list.

## 2018-09-17 ENCOUNTER — Encounter: Payer: Self-pay | Admitting: Family Medicine

## 2018-09-17 LAB — CMP14+EGFR
ALT: 24 IU/L (ref 0–32)
AST: 25 IU/L (ref 0–40)
Albumin/Globulin Ratio: 1.8 (ref 1.2–2.2)
Albumin: 4.6 g/dL (ref 3.8–4.8)
Alkaline Phosphatase: 77 IU/L (ref 39–117)
BUN/Creatinine Ratio: 25 (ref 12–28)
BUN: 20 mg/dL (ref 8–27)
Bilirubin Total: 0.5 mg/dL (ref 0.0–1.2)
CO2: 21 mmol/L (ref 20–29)
Calcium: 9.5 mg/dL (ref 8.7–10.3)
Chloride: 106 mmol/L (ref 96–106)
Creatinine, Ser: 0.79 mg/dL (ref 0.57–1.00)
GFR calc Af Amer: 89 mL/min/{1.73_m2} (ref >59)
GFR calc non Af Amer: 77 mL/min/{1.73_m2} (ref >59)
Globulin, Total: 2.6 g/dL (ref 1.5–4.5)
Glucose: 101 mg/dL — ABNORMAL HIGH (ref 65–99)
Potassium: 4.7 mmol/L (ref 3.5–5.2)
Sodium: 142 mmol/L (ref 134–144)
Total Protein: 7.2 g/dL (ref 6.0–8.5)

## 2018-09-17 LAB — CBC
Hematocrit: 43.4 % (ref 34.0–46.6)
Hemoglobin: 14.4 g/dL (ref 11.1–15.9)
MCH: 29.6 pg (ref 26.6–33.0)
MCHC: 33.2 g/dL (ref 31.5–35.7)
MCV: 89 fL (ref 79–97)
Platelets: 344 10*3/uL (ref 150–450)
RBC: 4.86 x10E6/uL (ref 3.77–5.28)
RDW: 13 % (ref 11.7–15.4)
WBC: 4.9 10*3/uL (ref 3.4–10.8)

## 2018-09-17 LAB — TSH: TSH: 0.46 u[IU]/mL (ref 0.450–4.500)

## 2018-10-06 DIAGNOSIS — H524 Presbyopia: Secondary | ICD-10-CM | POA: Diagnosis not present

## 2018-10-11 ENCOUNTER — Telehealth: Payer: Self-pay | Admitting: *Deleted

## 2018-10-11 NOTE — Telephone Encounter (Signed)
Pt placed some results in the mail for Dr. Andria Frames on Saturday.  She wants to make sure he is expecting it.  To PCP. Christen Bame, CMA

## 2018-10-11 NOTE — Telephone Encounter (Signed)
I will be on the lookout for this information and call patient after I have reviewed.  [employer]

## 2018-10-12 ENCOUNTER — Encounter: Payer: Self-pay | Admitting: Family Medicine

## 2018-10-12 NOTE — Progress Notes (Signed)
Patient paid for life line screening  Results included Normal carotid dopplers No a fib on 4 lead EKG Neg AAA screen Normal ABIs for PVD At risk for osteoporosis.  Yes, known osteopenia. Reassuring lipid panel Glu=104 C reactive protein 1.30  Discussed results with patient by phone.  No changes.

## 2018-11-01 IMAGING — RF DG HIP (WITH PELVIS) OPERATIVE*R*
1 series · 12 of 12 positions shown · non-contrast
Comparison: 08/05/2017 plain film exam.

CLINICAL DATA: 66-year-old female post right hip replacement.
Initial encounter.

EXAM:
DG C-ARM 61-120 MIN; OPERATIVE RIGHT HIP WITH PELVIS
Fluoroscopic time: 26 seconds.

[Series 1: unknown protocol · 0.20mm/px · 12 of 12 slices shown]
[im 1/12]
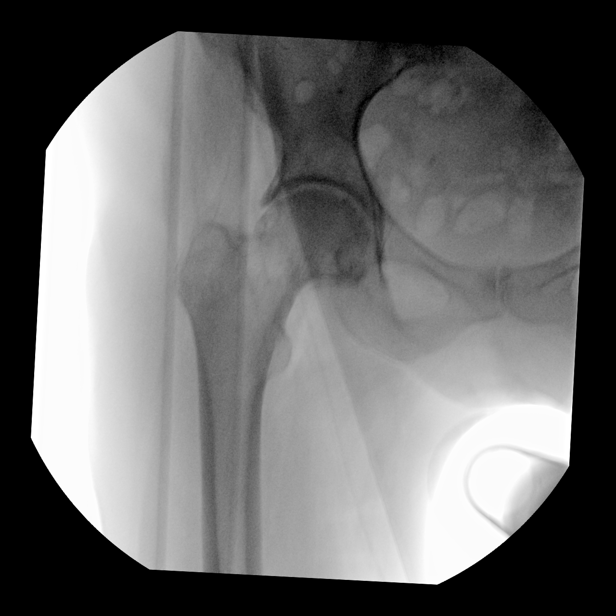
[im 2/12]
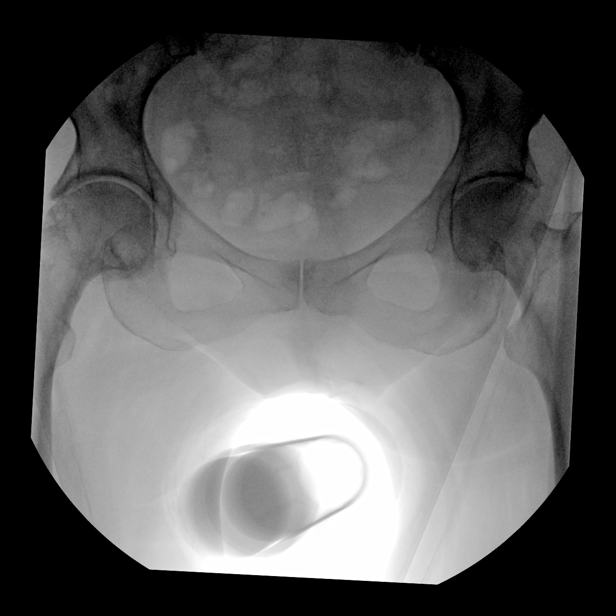
[im 3/12]
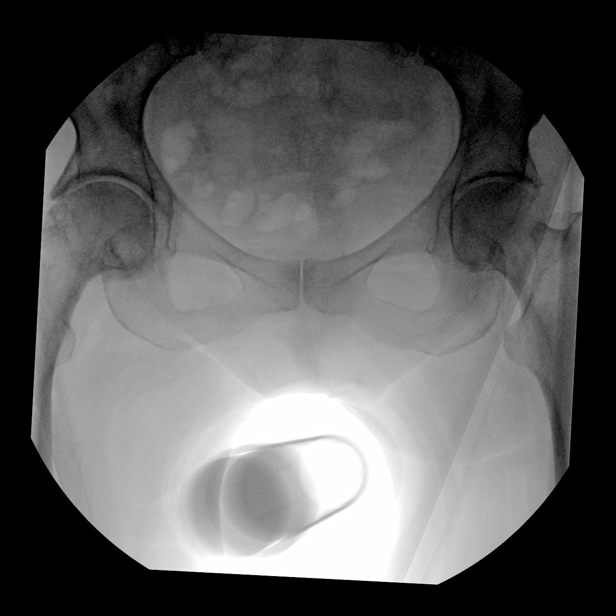
[im 4/12]
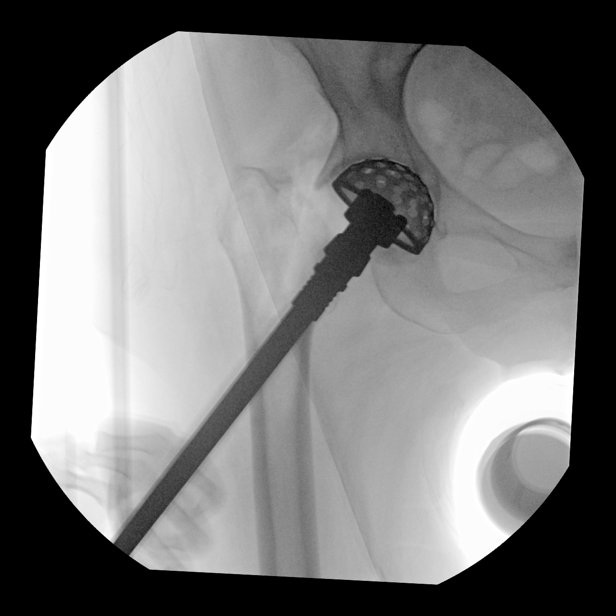
[im 5/12]
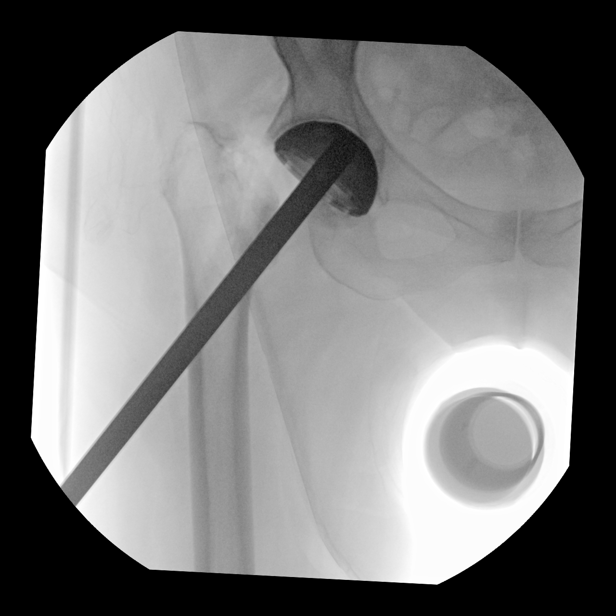
[im 6/12]
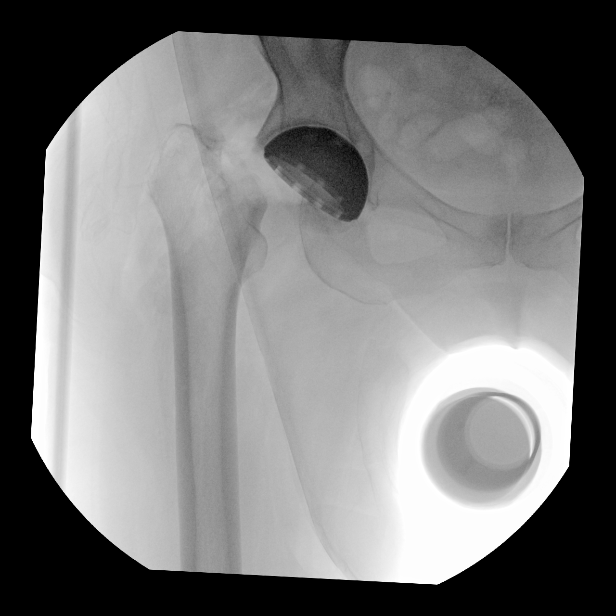
[im 7/12]
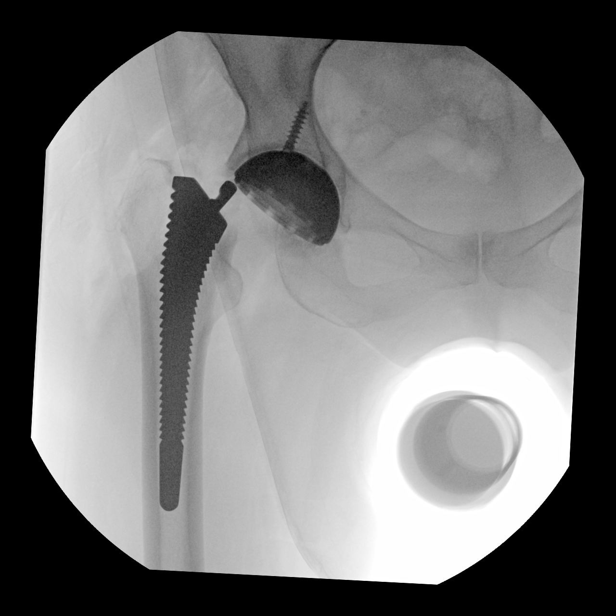
[im 8/12]
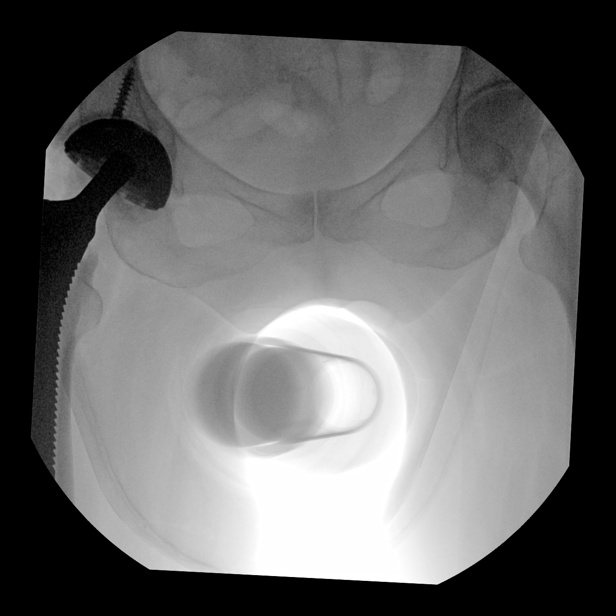
[im 9/12]
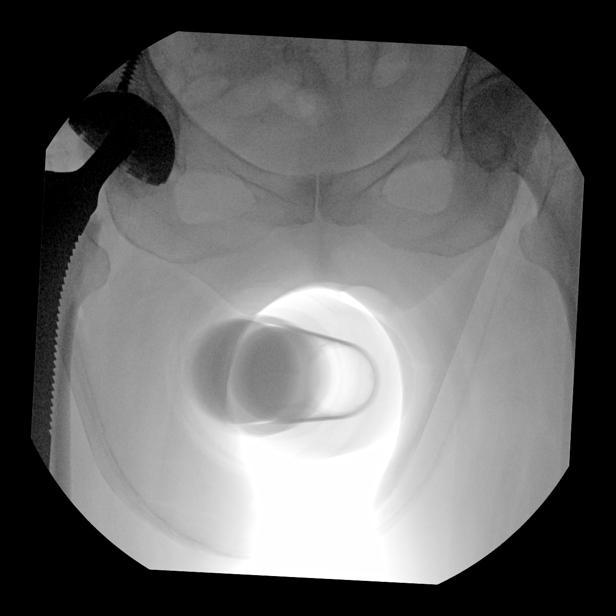
[im 10/12]
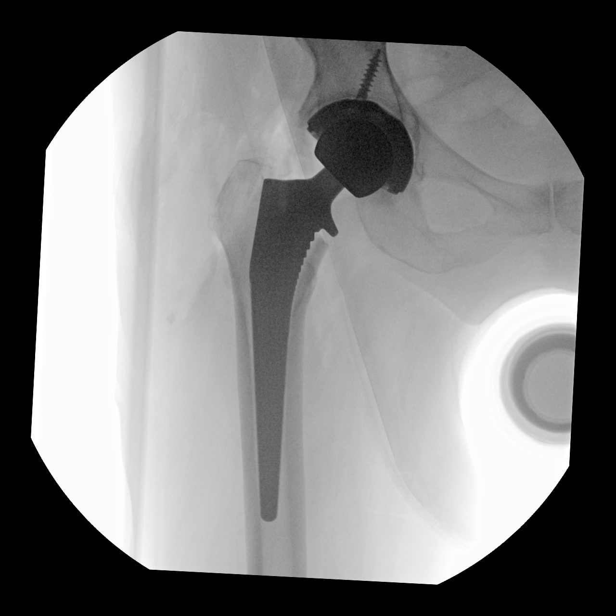
[im 11/12]
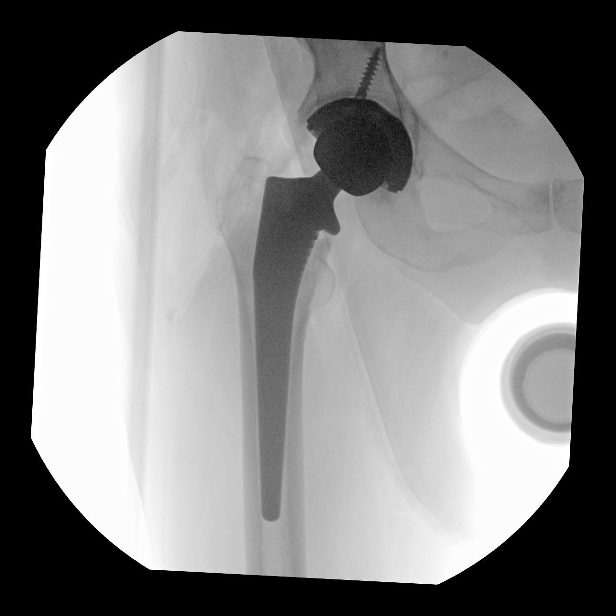
[im 12/12]
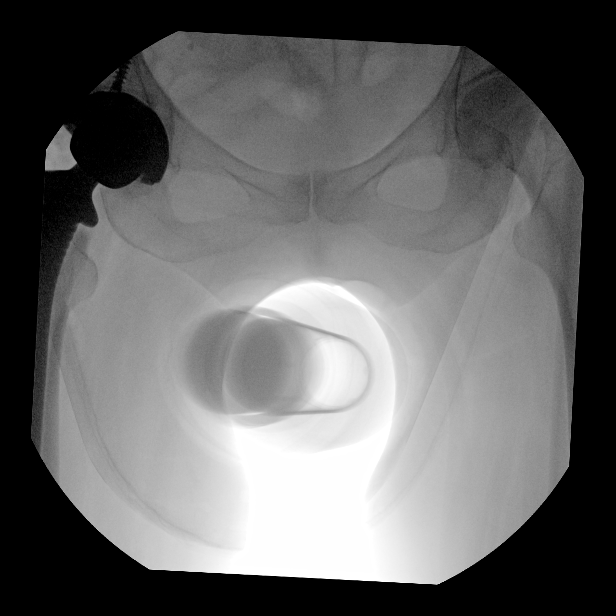

[12 of 12 positions shown; findings below may reference images not displayed]

FINDINGS: Ten intraoperative C-arm views submitted for review after surgery.

Placement right hip arthroplasty which appears in satisfactory
position on frontal projection without complication noted.
IMPRESSION: Post total right hip replacement.

## 2018-11-01 IMAGING — DX DG PORTABLE PELVIS
1 series · 1 of 1 positions shown · non-contrast
Comparison: None.

CLINICAL DATA: Status post right hip replacement

EXAM:
PORTABLE PELVIS 1-2 VIEWS

[pelvis ap]
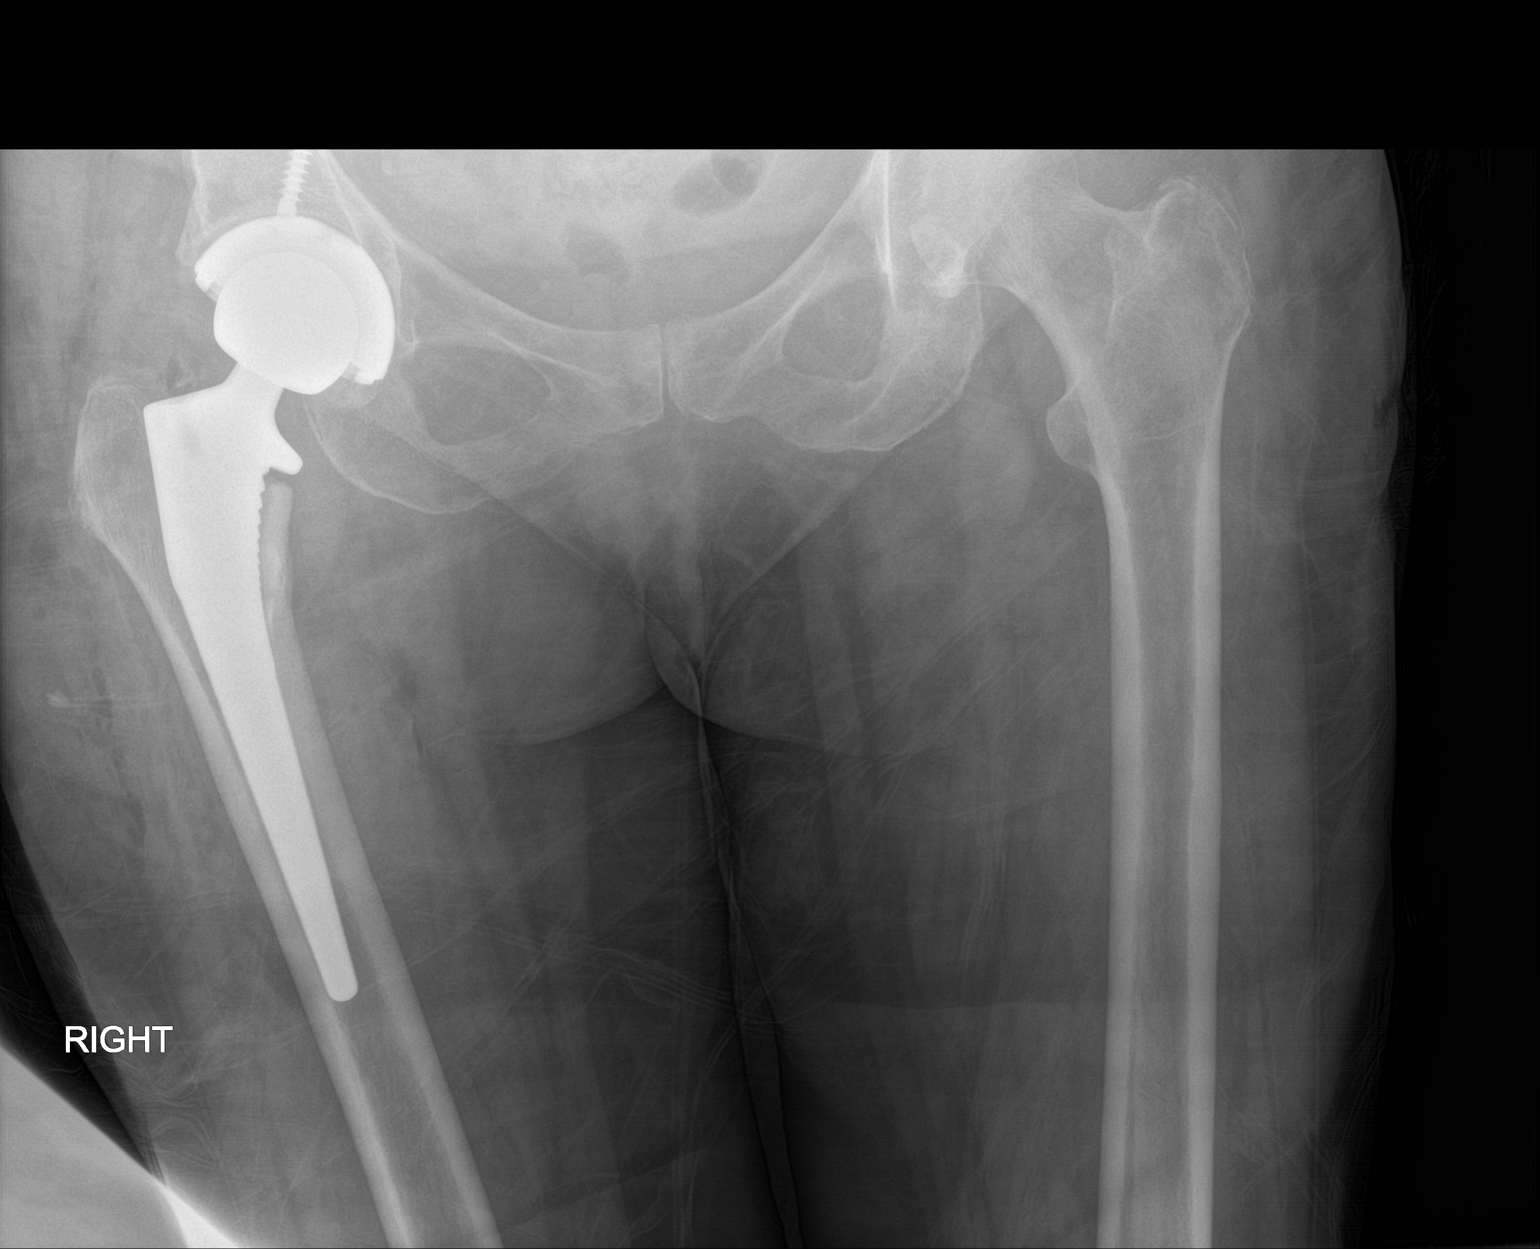

[1 of 1 positions shown; findings below may reference images not displayed]

FINDINGS: The patient is status post right hip replacement. Acetabular and
femoral components are in good position.
IMPRESSION: Right hip replacement as above.

## 2018-12-16 DIAGNOSIS — H40013 Open angle with borderline findings, low risk, bilateral: Secondary | ICD-10-CM | POA: Diagnosis not present

## 2018-12-16 DIAGNOSIS — H524 Presbyopia: Secondary | ICD-10-CM | POA: Diagnosis not present

## 2018-12-27 ENCOUNTER — Encounter: Payer: Self-pay | Admitting: Family Medicine

## 2018-12-27 ENCOUNTER — Other Ambulatory Visit: Payer: Self-pay

## 2018-12-27 ENCOUNTER — Ambulatory Visit (INDEPENDENT_AMBULATORY_CARE_PROVIDER_SITE_OTHER): Payer: Medicare HMO | Admitting: Family Medicine

## 2018-12-27 DIAGNOSIS — Z23 Encounter for immunization: Secondary | ICD-10-CM

## 2018-12-27 DIAGNOSIS — F4321 Adjustment disorder with depressed mood: Secondary | ICD-10-CM

## 2018-12-27 NOTE — Patient Instructions (Signed)
See me in Feb or March I am glad that you are set up for physical therapy.   Please gt back to exercising regularly. Stay strong.  You are a rock. I am glad Aimo is still helping you.

## 2018-12-28 ENCOUNTER — Encounter: Payer: Self-pay | Admitting: Family Medicine

## 2018-12-28 DIAGNOSIS — M6281 Muscle weakness (generalized): Secondary | ICD-10-CM | POA: Diagnosis not present

## 2018-12-28 DIAGNOSIS — M25651 Stiffness of right hip, not elsewhere classified: Secondary | ICD-10-CM | POA: Diagnosis not present

## 2018-12-28 NOTE — Progress Notes (Signed)
   Subjective:    Patient ID: Tammy Castro, female    DOB: [birth year], 68 y.o.   MRN: [id number]  HPI  FU adjustment reaction.  Please see office visit note of 09/16/2018. Things have improved and are still far from perfect.   1. Amora had a few sessions of counseling and stopped because she felt she had gotten the tools and support she needed.  She knows she can return if things begin to decompensate. 2. Minor concerns with her daughter continue - and perhaps are slightly worse.  Rutha felt most of her daughters problems were due to the husband, who she describes as an alcoholic.  She is now worried that her daughter is drinking.  Kynsie now completely abstains.  She continues to talk with her daughter frequently and cares for her grandchildren.  Aaminah has no concerns for the safety of her grandchildren. 3. Major concerns with adult son - and there has been clear improvement in this arena.  Lavesha continues to worry that her son is supporting and enabling a non employed, likely drug and alcohol using woman.  In July, their relationship was clearly angry and conflicted.  The anger has subsided and there is more discussion.  Thing are 50% improved.   4. Verdene is still lonely and misses her husband Aimo.  She is careful about eating and completely abstains from alcohol.  She has not been able to exercise due to a poor response from right hip surgery.  Last x-ray showed hetertropic calcium deposition.  She is about to start physical therapy with a goal of being able to exercise more.  She feels she is in a better place now.  No SI or HI.  Does not feel she needs drug therapy or counseling at present;.    Review of Systems     Objective:   Physical Exam  Affect good, normal throughout.  Greater than 50% of visit was counseling.  Duration of visit was 25 minutes.       Assessment & Plan:

## 2018-12-28 NOTE — Assessment & Plan Note (Signed)
Improved.  Continue with lifestyle modifications.  She continues to work on her relationships with her adult children.

## 2018-12-30 DIAGNOSIS — M25651 Stiffness of right hip, not elsewhere classified: Secondary | ICD-10-CM | POA: Diagnosis not present

## 2018-12-30 DIAGNOSIS — M6281 Muscle weakness (generalized): Secondary | ICD-10-CM | POA: Diagnosis not present

## 2019-01-04 DIAGNOSIS — M6281 Muscle weakness (generalized): Secondary | ICD-10-CM | POA: Diagnosis not present

## 2019-01-04 DIAGNOSIS — M25651 Stiffness of right hip, not elsewhere classified: Secondary | ICD-10-CM | POA: Diagnosis not present

## 2019-01-05 DIAGNOSIS — M6281 Muscle weakness (generalized): Secondary | ICD-10-CM | POA: Diagnosis not present

## 2019-01-05 DIAGNOSIS — M25651 Stiffness of right hip, not elsewhere classified: Secondary | ICD-10-CM | POA: Diagnosis not present

## 2019-01-07 DIAGNOSIS — M6281 Muscle weakness (generalized): Secondary | ICD-10-CM | POA: Diagnosis not present

## 2019-01-07 DIAGNOSIS — M25651 Stiffness of right hip, not elsewhere classified: Secondary | ICD-10-CM | POA: Diagnosis not present

## 2019-01-10 DIAGNOSIS — M6281 Muscle weakness (generalized): Secondary | ICD-10-CM | POA: Diagnosis not present

## 2019-01-10 DIAGNOSIS — M25651 Stiffness of right hip, not elsewhere classified: Secondary | ICD-10-CM | POA: Diagnosis not present

## 2019-01-11 DIAGNOSIS — M6281 Muscle weakness (generalized): Secondary | ICD-10-CM | POA: Diagnosis not present

## 2019-01-11 DIAGNOSIS — M25651 Stiffness of right hip, not elsewhere classified: Secondary | ICD-10-CM | POA: Diagnosis not present

## 2019-01-12 DIAGNOSIS — M25651 Stiffness of right hip, not elsewhere classified: Secondary | ICD-10-CM | POA: Diagnosis not present

## 2019-01-12 DIAGNOSIS — M6281 Muscle weakness (generalized): Secondary | ICD-10-CM | POA: Diagnosis not present

## 2019-01-17 DIAGNOSIS — M6281 Muscle weakness (generalized): Secondary | ICD-10-CM | POA: Diagnosis not present

## 2019-01-17 DIAGNOSIS — M25651 Stiffness of right hip, not elsewhere classified: Secondary | ICD-10-CM | POA: Diagnosis not present

## 2019-01-19 DIAGNOSIS — M25651 Stiffness of right hip, not elsewhere classified: Secondary | ICD-10-CM | POA: Diagnosis not present

## 2019-01-19 DIAGNOSIS — M6281 Muscle weakness (generalized): Secondary | ICD-10-CM | POA: Diagnosis not present

## 2019-01-21 DIAGNOSIS — M25651 Stiffness of right hip, not elsewhere classified: Secondary | ICD-10-CM | POA: Diagnosis not present

## 2019-01-21 DIAGNOSIS — M6281 Muscle weakness (generalized): Secondary | ICD-10-CM | POA: Diagnosis not present

## 2019-01-24 DIAGNOSIS — M6281 Muscle weakness (generalized): Secondary | ICD-10-CM | POA: Diagnosis not present

## 2019-01-24 DIAGNOSIS — M25651 Stiffness of right hip, not elsewhere classified: Secondary | ICD-10-CM | POA: Diagnosis not present

## 2019-01-26 DIAGNOSIS — M25651 Stiffness of right hip, not elsewhere classified: Secondary | ICD-10-CM | POA: Diagnosis not present

## 2019-01-26 DIAGNOSIS — M6281 Muscle weakness (generalized): Secondary | ICD-10-CM | POA: Diagnosis not present

## 2019-02-02 DIAGNOSIS — M25651 Stiffness of right hip, not elsewhere classified: Secondary | ICD-10-CM | POA: Diagnosis not present

## 2019-02-02 DIAGNOSIS — M6281 Muscle weakness (generalized): Secondary | ICD-10-CM | POA: Diagnosis not present

## 2019-02-14 DIAGNOSIS — M6281 Muscle weakness (generalized): Secondary | ICD-10-CM | POA: Diagnosis not present

## 2019-02-14 DIAGNOSIS — M25651 Stiffness of right hip, not elsewhere classified: Secondary | ICD-10-CM | POA: Diagnosis not present

## 2019-02-23 DIAGNOSIS — M6281 Muscle weakness (generalized): Secondary | ICD-10-CM | POA: Diagnosis not present

## 2019-02-23 DIAGNOSIS — M25651 Stiffness of right hip, not elsewhere classified: Secondary | ICD-10-CM | POA: Diagnosis not present

## 2019-03-16 DIAGNOSIS — M6281 Muscle weakness (generalized): Secondary | ICD-10-CM | POA: Diagnosis not present

## 2019-03-16 DIAGNOSIS — M25651 Stiffness of right hip, not elsewhere classified: Secondary | ICD-10-CM | POA: Diagnosis not present

## 2019-03-28 ENCOUNTER — Telehealth: Payer: Self-pay | Admitting: Family Medicine

## 2019-03-28 DIAGNOSIS — Z1211 Encounter for screening for malignant neoplasm of colon: Secondary | ICD-10-CM | POA: Insufficient documentation

## 2019-03-28 NOTE — Telephone Encounter (Signed)
Patient would like Dr. Andria Castro to put in an order for her to get a cologuard test sent to her house. Please call patient with any questions.

## 2019-03-28 NOTE — Assessment & Plan Note (Signed)
Had tubular adenoma on 2013 colonoscopy.  Neg colonoscopy 2015 with recommended 5 y follow up.  From website: Cologuard is not for high-risk individuals, including patients with a personal history of colorectal cancer and adenomas;  Not a candidate for cologuard due to previous adenoma

## 2019-03-28 NOTE — Telephone Encounter (Signed)
Called patient about cologuard testing.  Informed her she is not a candidate due to previous adenoma.  She will call her GI doc for colonoscopy.

## 2019-03-30 DIAGNOSIS — H40013 Open angle with borderline findings, low risk, bilateral: Secondary | ICD-10-CM | POA: Diagnosis not present

## 2019-04-18 ENCOUNTER — Other Ambulatory Visit: Payer: Self-pay

## 2019-04-18 ENCOUNTER — Encounter: Payer: Self-pay | Admitting: Orthopaedic Surgery

## 2019-04-18 ENCOUNTER — Ambulatory Visit: Payer: Medicare HMO | Admitting: Orthopaedic Surgery

## 2019-04-18 ENCOUNTER — Ambulatory Visit (INDEPENDENT_AMBULATORY_CARE_PROVIDER_SITE_OTHER): Payer: Medicare HMO

## 2019-04-18 DIAGNOSIS — M25551 Pain in right hip: Secondary | ICD-10-CM | POA: Diagnosis not present

## 2019-04-18 DIAGNOSIS — Z96641 Presence of right artificial hip joint: Secondary | ICD-10-CM | POA: Diagnosis not present

## 2019-04-18 NOTE — Progress Notes (Signed)
Office Visit Note   Patient: Tammy Castro           Date of Birth: Feb 02, 1951           MRN: [id number] Visit Date: 04/18/2019              Requested by: Zenia Resides, MD [address] [place],  [place] [address] PCP: Zenia Resides, MD   Assessment & Plan: Visit Diagnoses:  1. History of right hip replacement   2. Pain in right hip     Plan: At this point I would like to obtain an MRI of her right hip to assess the iliopsoas tendon and to look for any other complicating features of the hip replacement that may be causing her to have the pain that she is having.  I may end up recommending a fluoroscopic guided or ultrasound-guided injection around the iliopsoas tendon but will await the results of the MRI first.  She wishes to have this study done and I agree that this is reasonable now having had over 50 sessions of physical therapy and massage therapy.  Follow-Up Instructions: No follow-ups on file.  Follow-up will be after the MRI.  Orders:  Orders Placed This Encounter  Procedures  . XR HIP UNILAT W OR W/O PELVIS 1V RIGHT   No orders of the defined types were placed in this encounter.     Procedures: No procedures performed   Clinical Data: No additional findings.   Subjective: Chief Complaint  Patient presents with  . Right Hip - Pain  The patient is a 69 year old female who is now almost 20 months status post a right total hip arthroplasty.  She states that her hip still hurts quite a bit.  She still does not have the strength and range of motion and she has been through multiple therapy sessions and deep tissue massage.  She is concerned about the pain in her right hip.  This is especially prominent she states in the groin area.  She also feels that there is a mass in the groin area.  HPI  Review of Systems She currently denies any headache, chest pain, shortness of breath, fever, chills, nausea, vomiting  Objective: Vital Signs: There  were no vitals taken for this visit.  Physical Exam She is alert and orient x3 and in no acute distress Ortho Exam Examination of her right hip shows that has fluid and full range of motion.  Hip flexion does cause some pain in the groin area.  When I palpate toward the groin area where she also did feel that she is having pain, she may have an irritated lymph node in this area. Specialty Comments:  No specialty comments available.  Imaging: XR HIP UNILAT W OR W/O PELVIS 1V RIGHT  Result Date: 04/18/2019 An AP pelvis and lateral of the right hip shows a well-seated total hip arthroplasty with no evidence of loosening.  There is calcifications around the lesser trochanter that was seen a year ago as well.  This appears well-corticated and may represent tearing of the iliopsoas tendon at its insertion.    PMFS History: Patient Active Problem List   Diagnosis Date Noted  . Colon cancer screening 03/28/2019  . Reaction, adjustment, with depressed mood, prolonged 09/16/2018  . Osteopenia 08/31/2018  . Hemorrhoid 09/02/2017  . Status post total replacement of right hip 08/25/2017  . Essential hypertension, benign 10/24/2015  . Mild vitamin D deficiency 07/31/2015  . Multiple actinic keratoses  02/21/2015  . Rotator cuff syndrome, left 09/08/2013  . Breast cancer screening 11/10/2011  . Personal history of colonic adenoma 04/21/2011  . Routine general medical examination at a health care facility 04/26/2010  . Hypothyroidism 04/16/2006  . HYPERCHOLESTEROLEMIA 04/16/2006  . DIVERTICULITIS OF COLON, NOS 04/16/2006  . OSTEOARTHRITIS OF SPINE, NOS 04/16/2006   Past Medical History:  Diagnosis Date  . Arthritis   . Cancer (HCC)    Left nostril  . Complication of anesthesia   . Diverticulosis   . Osteoarthritis of hip    Right  . PONV (postoperative nausea and vomiting)    Can not remember the medicine used, but it was during the breast reduction surgery.  . Pre-diabetes   . Thyroid  disease   . Toxemia in pregnancy   . Varicose veins of legs    surgery scheduled for  07/01/11    Family History  Problem Relation Age of Onset  . Colon polyps Mother   . Cancer Mother   . Alzheimer's disease Father   . Parkinson's disease Brother   . Alcohol abuse Sister   . Alcohol abuse Brother   . Alcohol abuse Brother     Past Surgical History:  Procedure Laterality Date  . APPENDECTOMY    . BREAST BIOPSY Right   . BREAST REDUCTION SURGERY Bilateral   . COLON SURGERY    . COLONOSCOPY    . INGUINAL HERNIA REPAIR     right side  . KNEE ARTHROSCOPY     Left  . REDUCTION MAMMAPLASTY    . Ruptured colon     In 1995  . THYROID SURGERY     right side  . TOTAL HIP ARTHROPLASTY Right 08/25/2017   Procedure: RIGHT TOTAL HIP ARTHROPLASTY ANTERIOR APPROACH;  Surgeon: Mcarthur Rossetti, MD;  Location: [facility];  Service: Orthopedics;  Laterality: Right;   Social History   Occupational History  . Not on file  Tobacco Use  . Smoking status: Never Smoker  . Smokeless tobacco: Never Used  Substance and Sexual Activity  . Alcohol use: No  . Drug use: No  . Sexual activity: Not Currently

## 2019-04-19 ENCOUNTER — Telehealth: Payer: Self-pay | Admitting: Family Medicine

## 2019-04-19 NOTE — Telephone Encounter (Signed)
Called and we discussed her right hip problems.  S/P hip replacement.  Pain and limited ROM since.  Not improved despite extensive PT.  Visited ortho yesterday, plain Xray showed considerable heterotropic calcifications.  Likely tendon tear or tendonitis.  MRI ordered.  She is frustrated and wants me to follow along with Dr. Rush Farmer.

## 2019-04-19 NOTE — Telephone Encounter (Signed)
Pt is calling wanting dr to call her saying its URGENT something going on with her. Hip operation year and half ago, and having problems. Please advise.

## 2019-04-22 ENCOUNTER — Telehealth: Payer: Self-pay | Admitting: Family Medicine

## 2019-04-22 NOTE — Telephone Encounter (Signed)
Pt is calling to inform Dr. Andria Frames that she has her MRI on Sunday 04/24/19. She would like for Dr. Andria Frames to call her when he receives the results. She is hoping this is on Monday due to seeing Dr. Rush Farmer on Tuesday. I told her I can not guarantee that it will be Monday but as soon as Dr. Andria Frames is able to he will call and discuss.    The best call back number is [phone]

## 2019-04-24 ENCOUNTER — Other Ambulatory Visit: Payer: Self-pay

## 2019-04-24 ENCOUNTER — Ambulatory Visit (INDEPENDENT_AMBULATORY_CARE_PROVIDER_SITE_OTHER): Payer: Medicare HMO

## 2019-04-24 DIAGNOSIS — Z96641 Presence of right artificial hip joint: Secondary | ICD-10-CM

## 2019-04-24 DIAGNOSIS — Z471 Aftercare following joint replacement surgery: Secondary | ICD-10-CM | POA: Diagnosis not present

## 2019-04-25 NOTE — Telephone Encounter (Signed)
Called (and did some reading before I called).  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  This explains her hip problems.  No muscles or tendons torn.  Joint hardware in good place.  She has appointment with ortho tomorrow.  Options are limited.  By my read she can: 1. Put up with it 2. Undergo surgical removal with post op NSAIDs to prevent recurrence. 3. I am unclear if shock wave (like lithotripsy) is an option.   She would like Dr. Ninfa Linden and I to collaborate in creating the best treatment plan.

## 2019-04-25 NOTE — Telephone Encounter (Signed)
Hey Bill.  Thanks for your note on this patient.  I will be able to go over the MRI with her tomorrow.  Based on what I am seeing, I would recommend a steroid injection in the iliopsoas tendon area which can be done here in the office under direct fluoroscopy by my partner Dr. Ernestina Patches.  I think this is reasonable to try because it can be both therapeutic and diagnostic.  It could also help her avoid any type of surgery.  The hip replacement itself looks fine.  We will see what she thinks tomorrow when I go over this with her.  Usually surgery is only indicated if conservative treatment fails.  I do feel that this is really worth trying based on what the MRI is showing and her clinical exam shows.  Fortunately again, the hip replacement itself looks fine.

## 2019-04-26 ENCOUNTER — Other Ambulatory Visit: Payer: Self-pay

## 2019-04-26 ENCOUNTER — Ambulatory Visit (INDEPENDENT_AMBULATORY_CARE_PROVIDER_SITE_OTHER): Payer: Medicare HMO | Admitting: Orthopaedic Surgery

## 2019-04-26 ENCOUNTER — Encounter: Payer: Self-pay | Admitting: Orthopaedic Surgery

## 2019-04-26 DIAGNOSIS — Z96641 Presence of right artificial hip joint: Secondary | ICD-10-CM

## 2019-04-26 DIAGNOSIS — M25551 Pain in right hip: Secondary | ICD-10-CM | POA: Diagnosis not present

## 2019-04-26 NOTE — Progress Notes (Signed)
Tammy Castro comes in today to go over an MRI of her right hip.  She is 20 months out from a right total hip arthroplasty.  She states that she has no pain however she then describes pain in the anterior aspect of her hip.  She says some of this is playing on her mind in terms of her getting her full mobility back with that right hip.  At times she says it is even hurt her at all but she does not like that she is still somewhat stiff.  She still does describe some type of pain that is anterior.  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  I have also talked to her primary care physician Dr. Domenic Schwab about her case.  Today I went over the MRI with her.  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  The implant itself looks good.  There is no evidence of failure of any aspect of the implant in her right hip.  At this point I have recommended that she consider a fluoroscopic guided steroid injection around the lesser trochanter and the iliopsoas tendon at the right hip to see how this helps her from a symptomatic standpoint.  I have discussed this case with Dr. Ernestina Patches as well.  We will work on getting this scheduled.  All question concerns were answered addressed.  She will go ahead and schedule follow-up with me in 4 weeks from now and by then hopefully she will have had an intervention and we can assess the results of this.

## 2019-04-28 ENCOUNTER — Telehealth: Payer: Self-pay | Admitting: Orthopaedic Surgery

## 2019-04-28 ENCOUNTER — Other Ambulatory Visit: Payer: Self-pay | Admitting: Radiology

## 2019-04-28 DIAGNOSIS — Z96641 Presence of right artificial hip joint: Secondary | ICD-10-CM

## 2019-04-28 NOTE — Telephone Encounter (Signed)
Please advise on extended PT orders

## 2019-04-28 NOTE — Telephone Encounter (Signed)
Patient called and wants to request more PT. [facility] needs a faxed copy of extended orders per patient.  Please call patient to advise [phone]

## 2019-04-28 NOTE — Telephone Encounter (Signed)
[place] for PT to work on right hip strengthening and any modalities per the therapist

## 2019-04-28 NOTE — Telephone Encounter (Signed)
Faxed to [place] location. Called and notified patient.

## 2019-05-04 ENCOUNTER — Telehealth: Payer: Self-pay | Admitting: Orthopaedic Surgery

## 2019-05-04 NOTE — Telephone Encounter (Signed)
Patient is requesting CD of hip xrays. Please call when ready [phone]. She has signed a release. Thanks!

## 2019-05-06 NOTE — Telephone Encounter (Signed)
Left patient message that CD was ready for pickup

## 2019-05-11 ENCOUNTER — Telehealth: Payer: Self-pay | Admitting: Orthopaedic Surgery

## 2019-05-11 NOTE — Telephone Encounter (Signed)
Patient called. She is requesting the 04/18/19 Pelvis xray be printed out on paper. Please call when ready [phone]

## 2019-05-12 NOTE — Telephone Encounter (Signed)
Advised patient that x-ray print out is ready for pick up at her convenience.

## 2019-05-24 ENCOUNTER — Ambulatory Visit: Payer: Medicare HMO | Admitting: Orthopaedic Surgery

## 2019-05-31 ENCOUNTER — Encounter: Payer: Self-pay | Admitting: Family Medicine

## 2019-05-31 DIAGNOSIS — Z1211 Encounter for screening for malignant neoplasm of colon: Secondary | ICD-10-CM | POA: Diagnosis not present

## 2019-05-31 DIAGNOSIS — D123 Benign neoplasm of transverse colon: Secondary | ICD-10-CM | POA: Diagnosis not present

## 2019-05-31 DIAGNOSIS — K5730 Diverticulosis of large intestine without perforation or abscess without bleeding: Secondary | ICD-10-CM | POA: Diagnosis not present

## 2019-05-31 DIAGNOSIS — Z86010 Personal history of colonic polyps: Secondary | ICD-10-CM | POA: Diagnosis not present

## 2019-05-31 DIAGNOSIS — K648 Other hemorrhoids: Secondary | ICD-10-CM | POA: Diagnosis not present

## 2019-06-06 ENCOUNTER — Encounter: Payer: Self-pay | Admitting: Family Medicine

## 2019-06-06 DIAGNOSIS — K5732 Diverticulitis of large intestine without perforation or abscess without bleeding: Secondary | ICD-10-CM

## 2019-06-06 DIAGNOSIS — Z86010 Personal history of colonic polyps: Secondary | ICD-10-CM

## 2019-06-14 DIAGNOSIS — M6281 Muscle weakness (generalized): Secondary | ICD-10-CM | POA: Diagnosis not present

## 2019-06-14 DIAGNOSIS — Z96641 Presence of right artificial hip joint: Secondary | ICD-10-CM | POA: Diagnosis not present

## 2019-06-14 DIAGNOSIS — M25651 Stiffness of right hip, not elsewhere classified: Secondary | ICD-10-CM | POA: Diagnosis not present

## 2019-06-16 DIAGNOSIS — M6281 Muscle weakness (generalized): Secondary | ICD-10-CM | POA: Diagnosis not present

## 2019-06-16 DIAGNOSIS — M25651 Stiffness of right hip, not elsewhere classified: Secondary | ICD-10-CM | POA: Diagnosis not present

## 2019-06-16 DIAGNOSIS — Z96641 Presence of right artificial hip joint: Secondary | ICD-10-CM | POA: Diagnosis not present

## 2019-06-20 DIAGNOSIS — M6281 Muscle weakness (generalized): Secondary | ICD-10-CM | POA: Diagnosis not present

## 2019-06-20 DIAGNOSIS — M25651 Stiffness of right hip, not elsewhere classified: Secondary | ICD-10-CM | POA: Diagnosis not present

## 2019-06-20 DIAGNOSIS — Z96641 Presence of right artificial hip joint: Secondary | ICD-10-CM | POA: Diagnosis not present

## 2019-06-22 DIAGNOSIS — M6281 Muscle weakness (generalized): Secondary | ICD-10-CM | POA: Diagnosis not present

## 2019-06-22 DIAGNOSIS — M25651 Stiffness of right hip, not elsewhere classified: Secondary | ICD-10-CM | POA: Diagnosis not present

## 2019-06-22 DIAGNOSIS — Z96641 Presence of right artificial hip joint: Secondary | ICD-10-CM | POA: Diagnosis not present

## 2019-06-27 DIAGNOSIS — M25651 Stiffness of right hip, not elsewhere classified: Secondary | ICD-10-CM | POA: Diagnosis not present

## 2019-06-27 DIAGNOSIS — M6281 Muscle weakness (generalized): Secondary | ICD-10-CM | POA: Diagnosis not present

## 2019-06-27 DIAGNOSIS — Z96641 Presence of right artificial hip joint: Secondary | ICD-10-CM | POA: Diagnosis not present

## 2019-06-29 DIAGNOSIS — Z96641 Presence of right artificial hip joint: Secondary | ICD-10-CM | POA: Diagnosis not present

## 2019-06-29 DIAGNOSIS — M25651 Stiffness of right hip, not elsewhere classified: Secondary | ICD-10-CM | POA: Diagnosis not present

## 2019-06-29 DIAGNOSIS — M6281 Muscle weakness (generalized): Secondary | ICD-10-CM | POA: Diagnosis not present

## 2019-07-04 DIAGNOSIS — Z96641 Presence of right artificial hip joint: Secondary | ICD-10-CM | POA: Diagnosis not present

## 2019-07-04 DIAGNOSIS — M25651 Stiffness of right hip, not elsewhere classified: Secondary | ICD-10-CM | POA: Diagnosis not present

## 2019-07-04 DIAGNOSIS — M6281 Muscle weakness (generalized): Secondary | ICD-10-CM | POA: Diagnosis not present

## 2019-07-12 DIAGNOSIS — M6281 Muscle weakness (generalized): Secondary | ICD-10-CM | POA: Diagnosis not present

## 2019-07-12 DIAGNOSIS — M25651 Stiffness of right hip, not elsewhere classified: Secondary | ICD-10-CM | POA: Diagnosis not present

## 2019-07-12 DIAGNOSIS — Z96641 Presence of right artificial hip joint: Secondary | ICD-10-CM | POA: Diagnosis not present

## 2019-08-09 ENCOUNTER — Other Ambulatory Visit: Payer: Self-pay | Admitting: Family Medicine

## 2019-08-09 DIAGNOSIS — Z1231 Encounter for screening mammogram for malignant neoplasm of breast: Secondary | ICD-10-CM

## 2019-08-11 ENCOUNTER — Ambulatory Visit (INDEPENDENT_AMBULATORY_CARE_PROVIDER_SITE_OTHER): Payer: Medicare HMO

## 2019-08-11 ENCOUNTER — Other Ambulatory Visit: Payer: Self-pay

## 2019-08-11 DIAGNOSIS — Z1231 Encounter for screening mammogram for malignant neoplasm of breast: Secondary | ICD-10-CM | POA: Diagnosis not present

## 2019-09-01 ENCOUNTER — Encounter: Payer: Self-pay | Admitting: Family Medicine

## 2019-09-01 ENCOUNTER — Other Ambulatory Visit: Payer: Self-pay

## 2019-09-01 ENCOUNTER — Ambulatory Visit (INDEPENDENT_AMBULATORY_CARE_PROVIDER_SITE_OTHER): Payer: Medicare HMO | Admitting: Family Medicine

## 2019-09-01 DIAGNOSIS — I10 Essential (primary) hypertension: Secondary | ICD-10-CM | POA: Diagnosis not present

## 2019-09-01 DIAGNOSIS — Z86010 Personal history of colonic polyps: Secondary | ICD-10-CM

## 2019-09-01 DIAGNOSIS — E7800 Pure hypercholesterolemia, unspecified: Secondary | ICD-10-CM | POA: Diagnosis not present

## 2019-09-01 DIAGNOSIS — Z0000 Encounter for general adult medical examination without abnormal findings: Secondary | ICD-10-CM | POA: Diagnosis not present

## 2019-09-01 DIAGNOSIS — E559 Vitamin D deficiency, unspecified: Secondary | ICD-10-CM | POA: Diagnosis not present

## 2019-09-01 DIAGNOSIS — F4321 Adjustment disorder with depressed mood: Secondary | ICD-10-CM | POA: Diagnosis not present

## 2019-09-01 DIAGNOSIS — E038 Other specified hypothyroidism: Secondary | ICD-10-CM

## 2019-09-01 NOTE — Assessment & Plan Note (Signed)
Check lipid panel  

## 2019-09-01 NOTE — Assessment & Plan Note (Signed)
Check TSH 

## 2019-09-01 NOTE — Assessment & Plan Note (Signed)
Healthy female with no at risk behaviors.

## 2019-09-01 NOTE — Patient Instructions (Addendum)
You should get another colonoscopy in 2024 - three years. I will call with the test results. Get back to the diet and exercise.   I am glad about Mikael Life stress continue with [facility] and now being a caregiver.  Make sure you take care of yourself.

## 2019-09-01 NOTE — Assessment & Plan Note (Signed)
Reviewed findings with patient.
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
## 2019-09-01 NOTE — Assessment & Plan Note (Signed)
Markedly improved.  Situation is a little better and she is handling the stress well.

## 2019-09-02 ENCOUNTER — Encounter: Payer: Self-pay | Admitting: Family Medicine

## 2019-09-02 ENCOUNTER — Telehealth: Payer: Self-pay | Admitting: Family Medicine

## 2019-09-02 DIAGNOSIS — R7989 Other specified abnormal findings of blood chemistry: Secondary | ICD-10-CM

## 2019-09-02 DIAGNOSIS — E7800 Pure hypercholesterolemia, unspecified: Secondary | ICD-10-CM

## 2019-09-02 LAB — CBC
Hematocrit: 43.3 % (ref 34.0–46.6)
Hemoglobin: 14.3 g/dL (ref 11.1–15.9)
MCH: 30.5 pg (ref 26.6–33.0)
MCHC: 33 g/dL (ref 31.5–35.7)
MCV: 92 fL (ref 79–97)
Platelets: 328 10*3/uL (ref 150–450)
RBC: 4.69 x10E6/uL (ref 3.77–5.28)
RDW: 13 % (ref 11.7–15.4)
WBC: 5.1 10*3/uL (ref 3.4–10.8)

## 2019-09-02 LAB — CMP14+EGFR
ALT: 29 IU/L (ref 0–32)
AST: 30 IU/L (ref 0–40)
Albumin/Globulin Ratio: 1.6 (ref 1.2–2.2)
Albumin: 4.4 g/dL (ref 3.8–4.8)
Alkaline Phosphatase: 83 IU/L (ref 48–121)
BUN/Creatinine Ratio: 29 — ABNORMAL HIGH (ref 12–28)
BUN: 21 mg/dL (ref 8–27)
Bilirubin Total: 0.5 mg/dL (ref 0.0–1.2)
CO2: 22 mmol/L (ref 20–29)
Calcium: 9.5 mg/dL (ref 8.7–10.3)
Chloride: 104 mmol/L (ref 96–106)
Creatinine, Ser: 0.72 mg/dL (ref 0.57–1.00)
GFR calc Af Amer: 99 mL/min/{1.73_m2} (ref >59)
GFR calc non Af Amer: 86 mL/min/{1.73_m2} (ref >59)
Globulin, Total: 2.7 g/dL (ref 1.5–4.5)
Glucose: 108 mg/dL — ABNORMAL HIGH (ref 65–99)
Potassium: 4.7 mmol/L (ref 3.5–5.2)
Sodium: 140 mmol/L (ref 134–144)
Total Protein: 7.1 g/dL (ref 6.0–8.5)

## 2019-09-02 LAB — LIPID PANEL
Chol/HDL Ratio: 3.1 ratio (ref 0.0–4.4)
Cholesterol, Total: 232 mg/dL — ABNORMAL HIGH (ref 100–199)
HDL: 76 mg/dL (ref >39)
LDL Chol Calc (NIH): 140 mg/dL — ABNORMAL HIGH (ref 0–99)
Triglycerides: 95 mg/dL (ref 0–149)
VLDL Cholesterol Cal: 16 mg/dL (ref 5–40)

## 2019-09-02 LAB — TSH: TSH: 0.395 u[IU]/mL — ABNORMAL LOW (ref 0.450–4.500)

## 2019-09-02 LAB — VITAMIN D 25 HYDROXY (VIT D DEFICIENCY, FRACTURES): Vit D, 25-Hydroxy: 47.2 ng/mL (ref 30.0–100.0)

## 2019-09-02 NOTE — Assessment & Plan Note (Signed)
Had hypothyroidism in past.  Off all medications.  Now with borderline low TSH.  No treatment.  Repeat TSH in three months.

## 2019-09-02 NOTE — Telephone Encounter (Signed)
Called.  See problems of abnormal TSH and hypercholesterolemia.

## 2019-09-02 NOTE — Assessment & Plan Note (Signed)
Not surprisingly, LDL up with weight gain.  She declined statin.  She will work on diet and see me in three months for a repeat LDL

## 2019-10-17 DIAGNOSIS — R519 Headache, unspecified: Secondary | ICD-10-CM | POA: Diagnosis not present

## 2019-10-17 DIAGNOSIS — S0990XA Unspecified injury of head, initial encounter: Secondary | ICD-10-CM | POA: Diagnosis not present

## 2019-10-17 DIAGNOSIS — S13141A Dislocation of C3/C4 cervical vertebrae, initial encounter: Secondary | ICD-10-CM | POA: Diagnosis not present

## 2019-10-17 DIAGNOSIS — Z79899 Other long term (current) drug therapy: Secondary | ICD-10-CM | POA: Diagnosis not present

## 2019-10-17 DIAGNOSIS — I10 Essential (primary) hypertension: Secondary | ICD-10-CM | POA: Diagnosis not present

## 2019-10-17 DIAGNOSIS — S0093XA Contusion of unspecified part of head, initial encounter: Secondary | ICD-10-CM | POA: Diagnosis not present

## 2019-10-17 DIAGNOSIS — G8911 Acute pain due to trauma: Secondary | ICD-10-CM | POA: Diagnosis not present

## 2019-10-18 DIAGNOSIS — S0990XA Unspecified injury of head, initial encounter: Secondary | ICD-10-CM | POA: Diagnosis not present

## 2019-10-18 DIAGNOSIS — S13141A Dislocation of C3/C4 cervical vertebrae, initial encounter: Secondary | ICD-10-CM | POA: Diagnosis not present

## 2019-12-27 DIAGNOSIS — H524 Presbyopia: Secondary | ICD-10-CM | POA: Diagnosis not present

## 2020-02-24 DIAGNOSIS — H40013 Open angle with borderline findings, low risk, bilateral: Secondary | ICD-10-CM | POA: Diagnosis not present

## 2020-03-03 DIAGNOSIS — I10 Essential (primary) hypertension: Secondary | ICD-10-CM | POA: Diagnosis not present

## 2020-03-03 DIAGNOSIS — J189 Pneumonia, unspecified organism: Secondary | ICD-10-CM | POA: Diagnosis not present

## 2020-03-03 DIAGNOSIS — Z79899 Other long term (current) drug therapy: Secondary | ICD-10-CM | POA: Diagnosis not present

## 2020-03-03 DIAGNOSIS — Z20822 Contact with and (suspected) exposure to covid-19: Secondary | ICD-10-CM | POA: Diagnosis not present

## 2020-03-06 ENCOUNTER — Telehealth: Payer: Self-pay

## 2020-03-06 NOTE — Telephone Encounter (Signed)
Done. Tammy Castro, CMA  

## 2020-03-06 NOTE — Telephone Encounter (Signed)
Patient calls nurse line wanting to schedule an apt with PCP for "sickness." Patient reports she went to ED in [place] over the weekend for cough and not felling well. Patient reports she tested negative for Covid, however was told she has pneumonia. Patient states they "just gave me something and sent me home." Patient would like to be evaluated by PCP and states ED should be faxing her report with negative test. Will forward to PCP. The patient is scheduled for Monday at this time.

## 2020-03-06 NOTE — Telephone Encounter (Signed)
Spoke with patient and reviewed 1/15 ER visit in [place].  She does not feel any better compared to Sat.  She is COVID neg (illness began three days prior to ER visit.  I believe we can trust neg COVID test.  I told to come in tomorrow at [facility] and I would see.  April, Shari, Can you get someone to add a template and put Ms Rozenberg down to see me at [facility] on 1/19.

## 2020-03-07 ENCOUNTER — Encounter: Payer: Self-pay | Admitting: Family Medicine

## 2020-03-07 ENCOUNTER — Ambulatory Visit (INDEPENDENT_AMBULATORY_CARE_PROVIDER_SITE_OTHER): Payer: Medicare HMO | Admitting: Family Medicine

## 2020-03-07 ENCOUNTER — Other Ambulatory Visit: Payer: Self-pay

## 2020-03-07 VITALS — BP 128/80 | HR 82 | Ht 65.0 in | Wt 174.2 lb

## 2020-03-07 DIAGNOSIS — F4321 Adjustment disorder with depressed mood: Secondary | ICD-10-CM

## 2020-03-07 DIAGNOSIS — N3941 Urge incontinence: Secondary | ICD-10-CM | POA: Diagnosis not present

## 2020-03-07 DIAGNOSIS — J189 Pneumonia, unspecified organism: Secondary | ICD-10-CM | POA: Insufficient documentation

## 2020-03-07 DIAGNOSIS — Z23 Encounter for immunization: Secondary | ICD-10-CM

## 2020-03-07 MED ORDER — MIRABEGRON ER 25 MG PO TB24: 25.0000 mg | ORAL_TABLET | Freq: Every day | ORAL | 3 refills | Status: DC

## 2020-03-07 NOTE — Progress Notes (Signed)
    SUBJECTIVE:   CHIEF COMPLAINT / HPI:   Cough and other problems: 1. Patient sick x 1 week.  See my telephone note of 03/06/20.  In the last 24 hours, she feels like she has begun to improve.  + Cough, productive of clear sputum.  No fever. No SOB. No GI sx. No hx of COPD 2. Chronicstress.  She remains with complex relationships with her adult children.  Of course, she loves them with all her heart.  Simultaneously, she has conflict of issues with both of them.  She seems to be handling this OK at this time. 3. She complains of problems with her bowels and bladder "ever since" her hip surgery.  What she describes is urge incontence of the bladder and similarly urgency with BMs.  She refuses an exam today.       OBJECTIVE:   BP 128/80   Pulse 82   Ht 5\' 5"  (1.651 m)   Wt 174 lb 3.2 oz (79 kg)   SpO2 97%   BMI 28.99 kg/m    Gen well appearing.  Does have an occasional bronchitic cough during the interview.  Speaks in full sentences.   Neck no adenopathy Lungs clear with bilateral end exp wheeze.   Cardiac RRR without m or g   ASSESSMENT/PLAN:   Urge incontinence Will rx based on history.  She agrees to exam if fails to improve.  Pneumonia Diagnosed in ER.  Improving.  No additional therapy.  Reaction, adjustment, with depressed mood, prolonged Unchanged.  She continues to see a therapist intermitantly.       Zenia Resides, MD [place]

## 2020-03-07 NOTE — Assessment & Plan Note (Signed)
Diagnosed in ER.  Improving.  No additional therapy.

## 2020-03-07 NOTE — Assessment & Plan Note (Signed)
Will rx based on history.  She agrees to exam if fails to improve.

## 2020-03-07 NOTE — Assessment & Plan Note (Signed)
Unchanged.  She continues to see a therapist intermitantly.

## 2020-03-07 NOTE — Patient Instructions (Addendum)
I am so glad that you are getting better.   I will give you a flu shot today.   By my records you are due for a COVID booster now.  We give boosters here.  Call for a nurse visit for the booster when you have double checked.  I am not sure why they told you to wait until May.   I will try a new medication for your bladder. Please let me know if it helps you.  I prescribed brand name as you asked.

## 2020-03-12 ENCOUNTER — Ambulatory Visit: Payer: Medicare HMO | Admitting: Family Medicine

## 2020-03-14 ENCOUNTER — Ambulatory Visit: Payer: Medicare HMO | Admitting: Family Medicine

## 2020-06-11 ENCOUNTER — Ambulatory Visit: Payer: Medicare HMO | Admitting: Family Medicine

## 2020-06-13 ENCOUNTER — Ambulatory Visit (INDEPENDENT_AMBULATORY_CARE_PROVIDER_SITE_OTHER): Payer: Medicare HMO | Admitting: Family Medicine

## 2020-06-13 ENCOUNTER — Encounter: Payer: Self-pay | Admitting: Family Medicine

## 2020-06-13 ENCOUNTER — Other Ambulatory Visit: Payer: Self-pay

## 2020-06-13 ENCOUNTER — Other Ambulatory Visit: Payer: Self-pay | Admitting: Family Medicine

## 2020-06-13 VITALS — Ht 65.0 in | Wt 173.0 lb

## 2020-06-13 DIAGNOSIS — N3941 Urge incontinence: Secondary | ICD-10-CM | POA: Diagnosis not present

## 2020-06-13 DIAGNOSIS — B351 Tinea unguium: Secondary | ICD-10-CM

## 2020-06-13 LAB — POCT URINALYSIS DIP (MANUAL ENTRY)
Bilirubin, UA: NEGATIVE
Blood, UA: NEGATIVE
Glucose, UA: NEGATIVE mg/dL
Ketones, POC UA: NEGATIVE mg/dL
Leukocytes, UA: NEGATIVE
Nitrite, UA: NEGATIVE
Protein Ur, POC: NEGATIVE mg/dL
Spec Grav, UA: 1.025 (ref 1.010–1.025)
Urobilinogen, UA: 0.2 E.U./dL
pH, UA: 7 (ref 5.0–8.0)

## 2020-06-13 MED ORDER — OXYBUTYNIN CHLORIDE 5 MG PO TABS: 5.0000 mg | ORAL_TABLET | Freq: Two times a day (BID) | ORAL | 0 refills | Status: DC

## 2020-06-13 MED ORDER — CICLOPIROX OLAMINE 0.77 % EX SUSP: CUTANEOUS | 0 refills | Status: DC

## 2020-06-13 NOTE — Patient Instructions (Signed)
It was great seeing you today! Today you came in for overactive bladder as well as toe nail fungus.   We are changing your bladder medication to Oxybutynin twice daily. Continue to do your Kegel exercises regularly!  For your toe nail fungus, apply nail lacquer 8% solution once daily to affected nails with applicator brush, preferably at bedtime or 8 hours before washing; remove with alcohol every 7 days for up to 48 weeks.   Please check-out at the front desk before leaving the clinic. Please follow up with PCP in 6-8 weeks to check on the medicine changes,  but if you need to be seen earlier than that for any new issues we're happy to fit you in, just give [place] a call!  Visit Reminders: - Stop by the pharmacy to pick up your prescriptions - Continue to work on your healthy eating habits and incorporating exercise into your daily life.    If you haven't already, sign up for My Chart to have easy access to your labs results, and communication with your primary care physician.  Feel free to call with any questions or concerns at any time, at [phone].   Take care,  Dr. Shary Key [facility]

## 2020-06-13 NOTE — Progress Notes (Signed)
    SUBJECTIVE:   CHIEF COMPLAINT / HPI:   Ms. Tammy Castro is a 70 yo who presents with inability to control urination which she been dealing with for the past couple of years. States that on a daily basis she has leakage each time she gets the urge to use the restroom. She was seen by Dr. Andria Frames on 03/07/20 and was prescribed Mirabegron for her over active bladder. She is requesting a different medication because she states the Mirabegron helped a little bit, but is expensive and cost her about $500. Causes her occasional dizziness. States she has not been doing her Kegel exercises because she has been really busy and has been forgetting. States she has previously tried drinking cranberry juice without relief. Denies burning or pain on urination.   Patient also endorses toe fungus that she has had for a couple of years. Denies pain. Requesting to take something for it but would rather a topical solution than an oral pill due to side effects   OBJECTIVE:   Ht 5\' 5"  (1.651 m)   Wt 173 lb (78.5 kg)   BMI 28.79 kg/m    General: alert, pleasant, NAD CV: RRR no murmurs Resp: CTAB normal WOB GI: soft, non distended MSK: normal tone and strength  Extremities: !st toe with thickened yellow toe nails bilaterally.      ASSESSMENT/PLAN:   No problem-specific Assessment & Plan notes found for this encounter.  Urgency urinary incontinence  Previously on Mirabegron ER 25mg  but requesting to switch because cost. Also states it was not helping much and also causing her some dizziness.  - Oxybutynin 5mg  BID - Continue Kegel exercises daily   Onychomycosis Patient with thickened discolored 1st toes bilaterally.    - Ciclopirox apply daily at bedtime. Remove with alcohol every 7 days for up to 48 weeks    [place]

## 2020-07-24 ENCOUNTER — Other Ambulatory Visit: Payer: Self-pay | Admitting: Family Medicine

## 2020-07-24 DIAGNOSIS — I10 Essential (primary) hypertension: Secondary | ICD-10-CM | POA: Diagnosis not present

## 2020-07-24 DIAGNOSIS — S30860A Insect bite (nonvenomous) of lower back and pelvis, initial encounter: Secondary | ICD-10-CM | POA: Diagnosis not present

## 2020-07-24 DIAGNOSIS — L539 Erythematous condition, unspecified: Secondary | ICD-10-CM | POA: Diagnosis not present

## 2020-07-24 DIAGNOSIS — Z79899 Other long term (current) drug therapy: Secondary | ICD-10-CM | POA: Diagnosis not present

## 2020-07-25 NOTE — Telephone Encounter (Signed)
Received refill request for oxybutinin and med list also contains myrbetric.  Called patient to find out what she is taking.  "Nothing" because of side effects.  Refused refill and deleted both from her med list.

## 2020-09-14 ENCOUNTER — Telehealth: Payer: Self-pay | Admitting: Family Medicine

## 2020-09-14 NOTE — Telephone Encounter (Signed)
Returned call.  She is concerned that her daughter, Brayton Layman, has an acute GI problem and was in [facility] yesterday.  Asked that I look up her CT scan results.  Informed that I could not look in her chart because she is not my patient.  Then asked about expiditing GI referral.  I gave her office numbers for both [facility].

## 2020-09-14 NOTE — Telephone Encounter (Signed)
Patient is returning Dr. Lowella Bandy call. She would like for him to call her back when he gets a chance.   The best call back is [phone].

## 2020-12-10 ENCOUNTER — Encounter: Payer: Self-pay | Admitting: Family Medicine

## 2020-12-10 ENCOUNTER — Ambulatory Visit (INDEPENDENT_AMBULATORY_CARE_PROVIDER_SITE_OTHER): Payer: Medicare HMO | Admitting: Family Medicine

## 2020-12-10 ENCOUNTER — Other Ambulatory Visit: Payer: Self-pay

## 2020-12-10 VITALS — BP 147/79 | HR 67 | Wt 175.4 lb

## 2020-12-10 DIAGNOSIS — Z23 Encounter for immunization: Secondary | ICD-10-CM | POA: Diagnosis not present

## 2020-12-10 DIAGNOSIS — E7800 Pure hypercholesterolemia, unspecified: Secondary | ICD-10-CM | POA: Diagnosis not present

## 2020-12-10 DIAGNOSIS — Z96641 Presence of right artificial hip joint: Secondary | ICD-10-CM

## 2020-12-10 DIAGNOSIS — Z1239 Encounter for other screening for malignant neoplasm of breast: Secondary | ICD-10-CM | POA: Diagnosis not present

## 2020-12-10 DIAGNOSIS — R7989 Other specified abnormal findings of blood chemistry: Secondary | ICD-10-CM

## 2020-12-10 DIAGNOSIS — N3941 Urge incontinence: Secondary | ICD-10-CM

## 2020-12-10 DIAGNOSIS — Z0000 Encounter for general adult medical examination without abnormal findings: Secondary | ICD-10-CM | POA: Diagnosis not present

## 2020-12-10 DIAGNOSIS — I10 Essential (primary) hypertension: Secondary | ICD-10-CM | POA: Insufficient documentation

## 2020-12-10 DIAGNOSIS — R030 Elevated blood-pressure reading, without diagnosis of hypertension: Secondary | ICD-10-CM | POA: Insufficient documentation
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
## 2020-12-10 NOTE — Assessment & Plan Note (Signed)
I believe she will just need to live with her limited ROM.

## 2020-12-10 NOTE — Assessment & Plan Note (Signed)
She will start with exercises which previously worked well for her.

## 2020-12-10 NOTE — Assessment & Plan Note (Signed)
Healthy woman with good habits and no at risk behaviors.  She is up to date on most recommended HPDP interventions.

## 2020-12-10 NOTE — Assessment & Plan Note (Signed)
Check labs 

## 2020-12-10 NOTE — Assessment & Plan Note (Signed)
REcommended to have repeat mammo prior to 07/2011.

## 2020-12-10 NOTE — Assessment & Plan Note (Signed)
Not surprisingly, she wants to try non-pharmacologic treatment for today's elevated BP reading.

## 2020-12-10 NOTE — Patient Instructions (Signed)
I will call with lab test results next week.   Remind me and I will also send a paper.   I will give you instructions to sign up for MyChart. You will get a flu shot today. Come back for a nurse visit in three weeks to get your COVID booster. Also have the nurse check your blood pressure at that visit.

## 2020-12-11 LAB — CMP14+EGFR
ALT: 26 IU/L (ref 0–32)
AST: 29 IU/L (ref 0–40)
Albumin/Globulin Ratio: 1.7 (ref 1.2–2.2)
Albumin: 4.3 g/dL (ref 3.7–4.7)
Alkaline Phosphatase: 69 IU/L (ref 44–121)
BUN/Creatinine Ratio: 28 (ref 12–28)
BUN: 19 mg/dL (ref 8–27)
Bilirubin Total: 0.5 mg/dL (ref 0.0–1.2)
CO2: 24 mmol/L (ref 20–29)
Calcium: 9.2 mg/dL (ref 8.7–10.3)
Chloride: 103 mmol/L (ref 96–106)
Creatinine, Ser: 0.69 mg/dL (ref 0.57–1.00)
Globulin, Total: 2.6 g/dL (ref 1.5–4.5)
Glucose: 97 mg/dL (ref 70–99)
Potassium: 4.6 mmol/L (ref 3.5–5.2)
Sodium: 139 mmol/L (ref 134–144)
Total Protein: 6.9 g/dL (ref 6.0–8.5)
eGFR: 93 mL/min/{1.73_m2} (ref >59)

## 2020-12-11 LAB — LIPID PANEL
Chol/HDL Ratio: 3.5 ratio (ref 0.0–4.4)
Cholesterol, Total: 223 mg/dL — ABNORMAL HIGH (ref 100–199)
HDL: 63 mg/dL (ref >39)
LDL Chol Calc (NIH): 141 mg/dL — ABNORMAL HIGH (ref 0–99)
Triglycerides: 109 mg/dL (ref 0–149)
VLDL Cholesterol Cal: 19 mg/dL (ref 5–40)

## 2020-12-11 LAB — TSH: TSH: 0.305 u[IU]/mL — ABNORMAL LOW (ref 0.450–4.500)

## 2020-12-18 ENCOUNTER — Telehealth: Payer: Self-pay | Admitting: Family Medicine

## 2020-12-18 DIAGNOSIS — R7989 Other specified abnormal findings of blood chemistry: Secondary | ICD-10-CM
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
## 2020-12-25 ENCOUNTER — Other Ambulatory Visit: Payer: Self-pay

## 2020-12-25 ENCOUNTER — Other Ambulatory Visit: Payer: Medicare HMO

## 2020-12-25 DIAGNOSIS — R7989 Other specified abnormal findings of blood chemistry: Secondary | ICD-10-CM

## 2020-12-26 LAB — T3, FREE: T3, Free: 3 pg/mL (ref 2.0–4.4)

## 2020-12-26 LAB — T4, FREE: Free T4: 1.23 ng/dL (ref 0.82–1.77)

## 2020-12-28 DIAGNOSIS — H524 Presbyopia: Secondary | ICD-10-CM | POA: Diagnosis not present

## 2021-02-21 ENCOUNTER — Telehealth: Payer: Self-pay

## 2021-02-21 DIAGNOSIS — M25561 Pain in right knee: Secondary | ICD-10-CM

## 2021-02-21 NOTE — Telephone Encounter (Signed)
Patient calls nurse line reporting a fall ~ 3 weeks ago. Patient reports she hit her right knee when she "went down."  Patient denies hitting her head or any loss of consciousness. Patient reports her knee has been gradually becoming more and more painful. Patient reports intermittent swelling, however has been icing and resting.   Patient is requesting an order for imaging, however needs this done in [place]. Patient reports she lives an hour away from here and declines an apt at this time.   Will forward to PCP for advisement.

## 2021-02-21 NOTE — Telephone Encounter (Signed)
Order entered as requested

## 2021-02-22 ENCOUNTER — Ambulatory Visit (INDEPENDENT_AMBULATORY_CARE_PROVIDER_SITE_OTHER): Payer: Medicare HMO

## 2021-02-22 ENCOUNTER — Other Ambulatory Visit: Payer: Self-pay

## 2021-02-22 DIAGNOSIS — M25461 Effusion, right knee: Secondary | ICD-10-CM | POA: Diagnosis not present

## 2021-02-22 DIAGNOSIS — M25561 Pain in right knee: Secondary | ICD-10-CM | POA: Diagnosis not present

## 2021-02-22 NOTE — Telephone Encounter (Signed)
Attempted to inform patient, however no answer and VM box was full.  If patient calls back please let her know the imaging order has been placed.

## 2021-02-23 ENCOUNTER — Telehealth: Payer: Self-pay | Admitting: Family Medicine

## 2021-02-23 NOTE — Telephone Encounter (Signed)
Called and informed patient no fracture.

## 2021-03-27 DIAGNOSIS — J189 Pneumonia, unspecified organism: Secondary | ICD-10-CM | POA: Diagnosis not present

## 2021-03-27 DIAGNOSIS — R509 Fever, unspecified: Secondary | ICD-10-CM | POA: Diagnosis not present

## 2021-03-27 DIAGNOSIS — J1282 Pneumonia due to coronavirus disease 2019: Secondary | ICD-10-CM | POA: Diagnosis not present

## 2021-03-27 DIAGNOSIS — U071 COVID-19: Secondary | ICD-10-CM | POA: Diagnosis not present

## 2021-03-27 DIAGNOSIS — R058 Other specified cough: Secondary | ICD-10-CM | POA: Diagnosis not present

## 2021-03-27 DIAGNOSIS — R918 Other nonspecific abnormal finding of lung field: Secondary | ICD-10-CM | POA: Diagnosis not present

## 2021-04-10 ENCOUNTER — Other Ambulatory Visit: Payer: Self-pay

## 2021-04-10 ENCOUNTER — Ambulatory Visit (INDEPENDENT_AMBULATORY_CARE_PROVIDER_SITE_OTHER): Payer: Medicare HMO

## 2021-04-10 DIAGNOSIS — Z0000 Encounter for general adult medical examination without abnormal findings: Secondary | ICD-10-CM

## 2021-04-10 NOTE — Progress Notes (Signed)
Subjective:   Tammy Castro is a 71 y.o. female who presents for Medicare Annual (Subsequent) preventive examination.  Patient consented to have virtual visit and was identified by name and date of birth. Method of visit: Telephone  Encounter participants: Patient: BRENNAN KARAM - located at Home Nurse/Provider: Dorna Bloom - located at [facility] Others (if applicable): NA  Review of Systems: Defer to PCP.  Cardiac Risk Factors include: advanced age (>55mn, >>23women)  Objective:   Vitals: There were no vitals taken for this visit.  There is no height or weight on file to calculate BMI.  Advanced Directives 04/10/2021 12/10/2020 09/01/2019 09/16/2018 08/25/2017 08/25/2017 08/12/2017  Does Patient Have a Medical Advance Directive? No No No No Yes Yes No  Type of Advance Directive - - - - HPress photographerLiving will [place] will Living will  Does patient want to make changes to medical advance directive? - - - - No - Patient declined No - Patient declined No - Patient declined  Copy of [place] Chart? - - - - No - copy requested No - copy requested -  Would patient like information on creating a medical advance directive? Yes (MAU/Ambulatory/Procedural Areas - Information given) No - Patient declined No - Patient declined No - Patient declined No - Patient declined - No - Patient declined   Tobacco Social History   Tobacco Use  Smoking Status Never   Passive exposure: Never  Smokeless Tobacco Never     Clinical Intake:  Pre-visit preparation completed: Yes  How often do you need to have someone help you when you read instructions, pamphlets, or other written materials from your doctor or pharmacy?: 2 - Rarely What is the last grade level you completed in school?: High School  Interpreter Needed?: No  Past Medical History:  Diagnosis Date   Arthritis    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   Pre-diabetes    Thyroid disease    Toxemia in pregnancy    Varicose veins of legs    surgery scheduled for  07/01/11   Past Surgical History:  Procedure Laterality Date   APPENDECTOMY     BREAST BIOPSY Right    BREAST REDUCTION SURGERY Bilateral    COLON SURGERY     COLONOSCOPY     INGUINAL HERNIA REPAIR     right side   KNEE ARTHROSCOPY     Left   REDUCTION MAMMAPLASTY     Ruptured colon     In 1995   THYROID SURGERY     right side   TOTAL HIP ARTHROPLASTY Right 08/25/2017   Procedure: RIGHT TOTAL HIP ARTHROPLASTY ANTERIOR APPROACH;  Surgeon: BMcarthur Rossetti MD;  Location: [facility];  Service: Orthopedics;  Laterality: Right;   Family History  Problem Relation Age of Onset   Colon polyps Mother    Cancer Mother    Alzheimer's disease Father    Alcohol abuse Sister    Multiple sclerosis Sister    Parkinson's disease Brother    Alcohol abuse Brother    Alcohol abuse Brother    Heart disease Daughter    Heart disease Son    Social History  Socioeconomic History   Marital status: Widowed    Spouse name: Not on file   Number of children: 2   Years of education: 81   Highest education level: 12th grade  Occupational History   Not on file  Tobacco Use   Smoking status: Never    Passive exposure: Never   Smokeless tobacco: Never  Vaping Use   Vaping Use: Never used  Substance and Sexual Activity   Alcohol use: No    Comment: hx of occ use   Drug use: No   Sexual activity: Not Currently  Other Topics Concern   Not on file  Social History Narrative   Patient lives alone with her cat Musta.   Patient is a widow.   Patient has one son and one daughter.   Patient has been doing Tai Chi, walking at parks and swimming for exercise.   Patient is losing weight and very happy  about this.     Social Determinants of Health   Financial Resource Strain: Low Risk    Difficulty of Paying Living Expenses: Not hard at all  Food Insecurity: No Food Insecurity   Worried About Charity fundraiser in the Last Year: Never true   [place] in the Last Year: Never true  Transportation Needs: No Transportation Needs   Lack of Transportation (Medical): No   Lack of Transportation (Non-Medical): No  Physical Activity: Sufficiently Active   Days of Exercise per Week: 3 days   Minutes of Exercise per Session: 60 min  Stress: Stress Concern Present   Feeling of Stress : To some extent  Social Connections: Moderately Integrated   Frequency of Communication with Friends and Family: More than three times a week   Frequency of Social Gatherings with Friends and Family: More than three times a week   Attends Religious Services: More than 4 times per year   Active Member of Clubs or Organizations: No   Attends Music therapist: More than 4 times per year   Marital Status: Widowed   Outpatient Encounter Medications as of 04/10/2021  Medication Sig   calcium-vitamin D (OSCAL WITH D 500-200) 500-200 MG-UNIT per tablet Take 1 tablet by mouth 2 (two) times daily.     COCONUT OIL PO Take 1 Scoop by mouth daily.   Collagen Hydrolysate POWD Take 1 Scoop by mouth daily.   Lactobacillus (PROBIOTIC ACIDOPHILUS PO) Take 250 mg by mouth daily.   Lecithin 400 MG CAPS Take 400 mg by mouth daily.   Magnesium 400 MG CAPS Take 400 mg by mouth daily.    Multiple Vitamins-Minerals (COMPLETE MULTIVITAMIN/MINERAL PO) Take 1 tablet by mouth once a day.    Nutritional Supplements (LIVER DEFENSE PO) Take 1 tablet by mouth daily.    Omega 3-6-9 Fatty Acids (OMEGA-3 & OMEGA-6 FISH OIL) CAPS Take 1 capsule by mouth 2 (two) times daily.   VITAMIN K, PHYTONADIONE, PO Take 1 tablet by mouth daily.    ciclopirox (LOPROX) 0.77 % SUSP Apply nail lacquer 8% solution once daily to affected nails  with applicator brush, preferably at bedtime or 8 hours before washing; remove with alcohol every 7 days (Patient not taking: Reported on 04/10/2021)   Melatonin 10 MG CAPS Take 20 mg by mouth at bedtime.  (Patient not taking: Reported on 04/10/2021)   vitamin E 1000 UNIT capsule Take 1,000 Units by mouth daily. (Patient not taking: Reported on 04/10/2021)   No facility-administered encounter medications on file as of  04/10/2021.   Activities of Daily Living In your present state of health, do you have any difficulty performing the following activities: 04/10/2021  Hearing? N  Vision? N  Difficulty concentrating or making decisions? N  Walking or climbing stairs? N  Dressing or bathing? N  Doing errands, shopping? N  Preparing Food and eating ? N  Using the Toilet? N  In the past six months, have you accidently leaked urine? Y  Do you have problems with loss of bowel control? N  Managing your Medications? N  Managing your Finances? N  Housekeeping or managing your Housekeeping? N  Some recent data might be hidden   Patient Care Team: Zenia Resides, MD as PCP - General Laurence Aly, OD (Optometry) Mcarthur Rossetti, MD as Consulting Physician (Orthopedic Surgery) Lucienne Capers, MD as Referring Physician (Internal Medicine)    Assessment:   This is a routine wellness examination for Tammy Castro.  Exercise Activities and Dietary recommendations Current Exercise Habits: The patient does not participate in regular exercise at present, Exercise limited by: None identified   Goals      Reduce salt intake to 2 grams per day or less     Weight (lb) < 148 lb (67.1 kg)     7% weight       Fall Risk Fall Risk  04/10/2021 12/10/2020 09/16/2018 08/12/2017 03/26/2017  Falls in the past year? 1 0 0 No No  Number falls in past yr: 0 0 - - -  Injury with Fall? 1 0 - - -  Risk for fall due to : Other (Comment) - - - -  Risk for fall due to: Comment Fell on ice - - - -  Follow up Falls  prevention discussed - - - -   Is the patient's home free of loose throw rugs in walkways, pet beds, electrical cords, etc?   yes      Grab bars in the bathroom? yes      Handrails on the stairs?   yes      Adequate lighting?   yes  Patient rating of health (0-10) scale: 10  Depression Screen PHQ 2/9 Scores 04/10/2021 12/10/2020 03/07/2020 09/01/2019  PHQ - 2 Score 0 0 0 0  PHQ- 9 Score - 0 1 -   Cognitive Function 6CIT Screen 04/10/2021  What Year? 0 points  What month? 0 points  What time? 0 points  Count back from 20 0 points  Months in reverse 0 points  Repeat phrase 0 points  Total Score 0   Immunization History  Administered Date(s) Administered   Fluad Quad(high Dose 65+) 12/27/2018, 12/10/2020   Influenza Split 11/03/2011, 11/12/2013   Influenza Whole 11/30/2007, 11/29/2008, 12/05/2009   Influenza, High Dose Seasonal PF 12/15/2016, 11/07/2017   Influenza,inj,Quad PF,6+ Mos 10/24/2015, 03/07/2020   Influenza-Unspecified 11/07/2017   PFIZER Comirnaty(Gray Top)Covid-19 Tri-Sucrose Vaccine 06/12/2020   PFIZER(Purple Top)SARS-COV-2 Vaccination 04/12/2019, 05/11/2019   Pneumococcal Conjugate-13 01/04/2015   Pneumococcal Polysaccharide-23 02/18/2004, 01/22/2016   Td 01/17/2002   Tdap 04/13/2013   Zoster Recombinat (Shingrix) 11/07/2017, 01/09/2018   Zoster, Live 12/18/2009   Screening Tests Health Maintenance  Topic Date Due   COVID-19 Vaccine (4 - Booster for [place] series) 08/07/2020   MAMMOGRAM  08/10/2021   COLONOSCOPY (Pts 45-61yr Insurance coverage will need to be confirmed)  05/31/2022   TETANUS/TDAP  04/14/2023   Pneumonia Vaccine 72 Years old  Completed   INFLUENZA VACCINE  Completed   DEXA SCAN  Completed   Hepatitis  C Screening  Completed   Zoster Vaccines- Shingrix  Completed   HPV VACCINES  Aged Out   Cancer Screenings: Lung: Low Dose CT Chest recommended if Age 56-80 years, 20 pack-year currently smoking OR have quit w/in 15years. Patient does not  qualify. Breast:  Up to date on Mammogram? Yes  Due- 07/2021 Up to date of Bone Density/Dexa? Yes Colorectal: UTD- Due 2024   Additional Screenings: Hepatitis C Screening: Completed  HIV Screening: Completed   Plan:  PCP apt scheduled for 4/13 Plan to get bivalent booster at this apt.  Keep up the good work with weight loss!  I have personally reviewed and noted the following in the patients chart:   Medical and social history Use of alcohol, tobacco or illicit drugs  Current medications and supplements Functional ability and status Nutritional status Physical activity Advanced directives List of other physicians Hospitalizations, surgeries, and ER visits in previous 12 months Vitals Screenings to include cognitive, depression, and falls Referrals and appointments  In addition, I have reviewed and discussed with patient certain preventive protocols, quality metrics, and best practice recommendations. A written personalized care plan for preventive services as well as general preventive health recommendations were provided to patient.  This visit was conducted virtually in the setting of the [place] pandemic.    Dorna Bloom, [place]  04/17/2021

## 2021-04-17 NOTE — Progress Notes (Signed)
I have reviewed this visit and agree with the documentation.   

## 2021-04-17 NOTE — Patient Instructions (Signed)
You spoke to Tammy Castro, [place] over the phone for your annual wellness visit.  We discussed goals:   Goals      Reduce salt intake to 2 grams per day or less     Weight (lb) < 148 lb (67.1 kg)     7% weight       We also discussed recommended health maintenance. As discussed, you a due for: Health Maintenance  Topic Date Due   COVID-19 Vaccine (4 - Booster for [place] series) 08/07/2020   MAMMOGRAM  08/10/2021   COLONOSCOPY (Pts 45-28yr Insurance coverage will need to be confirmed)  05/31/2022   TETANUS/TDAP  04/14/2023   Pneumonia Vaccine 71 Years old  Completed   INFLUENZA VACCINE  Completed   DEXA SCAN  Completed   Hepatitis C Screening  Completed   Zoster Vaccines- Shingrix  Completed   HPV VACCINES  Aged Out   PCP apt scheduled for 4/13 Plan to get bivalent booster at this apt.  Keep up the good work with weight loss!  Preventive Care 72Years and Older, Female Preventive care refers to lifestyle choices and visits with your health care provider that can promote health and wellness. Preventive care visits are also called wellness exams. What can I expect for my preventive care visit? Counseling Your health care provider may ask you questions about your: Medical history, including: Past medical problems. Family medical history. Pregnancy and menstrual history. History of falls. Current health, including: Memory and ability to understand (cognition). Emotional well-being. Home life and relationship well-being. Sexual activity and sexual health. Lifestyle, including: Alcohol, nicotine or tobacco, and drug use. Access to firearms. Diet, exercise, and sleep habits. Work and work eStatistician Sunscreen use. Safety issues such as seatbelt and bike helmet use. Physical exam Your health care provider will check your: Height and weight. These may be used to calculate your BMI (body mass index). BMI is a measurement that tells if you are at a healthy weight. Waist  circumference. This measures the distance around your waistline. This measurement also tells if you are at a healthy weight and may help predict your risk of certain diseases, such as type 2 diabetes and high blood pressure. Heart rate and blood pressure. Body temperature. Skin for abnormal spots. What immunizations do I need? Vaccines are usually given at various ages, according to a schedule. Your health care provider will recommend vaccines for you based on your age, medical history, and lifestyle or other factors, such as travel or where you work. What tests do I need? Screening Your health care provider may recommend screening tests for certain conditions. This may include: Lipid and cholesterol levels. Hepatitis C test. Hepatitis B test. HIV (human immunodeficiency virus) test. STI (sexually transmitted infection) testing, if you are at risk. Lung cancer screening. Colorectal cancer screening. Diabetes screening. This is done by checking your blood sugar (glucose) after you have not eaten for a while (fasting). Mammogram. Talk with your health care provider about how often you should have regular mammograms. BRCA-related cancer screening. This may be done if you have a family history of breast, ovarian, tubal, or peritoneal cancers. Bone density scan. This is done to screen for osteoporosis. Talk with your health care provider about your test results, treatment options, and if necessary, the need for more tests. Follow these instructions at home: Eating and drinking  Eat a diet that includes fresh fruits and vegetables, whole grains, lean protein, and low-fat dairy products. Limit your intake of foods with  high amounts of sugar, saturated fats, and salt. Take vitamin and mineral supplements as recommended by your health care provider. Do not drink alcohol if your health care provider tells you not to drink. If you drink alcohol: Limit how much you have to 0-1 drink a day. Know how  much alcohol is in your drink. In the [place] one drink equals one 12 oz bottle of beer (355 mL), one 5 oz glass of wine (148 mL), or one 1 oz glass of hard liquor (44 mL). Lifestyle Brush your teeth every morning and night with fluoride toothpaste. Floss one time each day. Exercise for at least 30 minutes 5 or more days each week. Do not use any products that contain nicotine or tobacco. These products include cigarettes, chewing tobacco, and vaping devices, such as e-cigarettes. If you need help quitting, ask your health care provider. Do not use drugs. If you are sexually active, practice safe sex. Use a condom or other form of protection in order to prevent STIs. Take aspirin only as told by your health care provider. Make sure that you understand how much to take and what form to take. Work with your health care provider to find out whether it is safe and beneficial for you to take aspirin daily. Ask your health care provider if you need to take a cholesterol-lowering medicine (statin). Find healthy ways to manage stress, such as: Meditation, yoga, or listening to music. Journaling. Talking to a trusted person. Spending time with friends and family. Minimize exposure to UV radiation to reduce your risk of skin cancer. Safety Always wear your seat belt while driving or riding in a vehicle. Do not drive: If you have been drinking alcohol. Do not ride with someone who has been drinking. When you are tired or distracted. While texting. If you have been using any mind-altering substances or drugs. Wear a helmet and other protective equipment during sports activities. If you have firearms in your house, make sure you follow all gun safety procedures. What's next? Visit your health care provider once a year for an annual wellness visit. Ask your health care provider how often you should have your eyes and teeth checked. Stay up to date on all vaccines. This information is not intended to  replace advice given to you by your health care provider. Make sure you discuss any questions you have with your health care provider. Document Revised: 08/01/2020 Document Reviewed: 08/01/2020 Elsevier Patient Education  2022 [place] clinic's number is [phone]. Please call with questions or concerns about what we discussed today.

## 2021-04-26 ENCOUNTER — Other Ambulatory Visit: Payer: Self-pay | Admitting: Family Medicine

## 2021-04-26 DIAGNOSIS — Z1231 Encounter for screening mammogram for malignant neoplasm of breast: Secondary | ICD-10-CM

## 2021-05-02 ENCOUNTER — Ambulatory Visit (INDEPENDENT_AMBULATORY_CARE_PROVIDER_SITE_OTHER): Payer: Medicare HMO

## 2021-05-02 ENCOUNTER — Other Ambulatory Visit: Payer: Self-pay

## 2021-05-02 DIAGNOSIS — Z1231 Encounter for screening mammogram for malignant neoplasm of breast: Secondary | ICD-10-CM | POA: Diagnosis not present

## 2021-05-30 ENCOUNTER — Encounter: Payer: Self-pay | Admitting: Family Medicine

## 2021-05-30 ENCOUNTER — Ambulatory Visit (INDEPENDENT_AMBULATORY_CARE_PROVIDER_SITE_OTHER): Payer: Medicare HMO | Admitting: Family Medicine

## 2021-05-30 DIAGNOSIS — R7989 Other specified abnormal findings of blood chemistry: Secondary | ICD-10-CM | POA: Diagnosis not present

## 2021-05-30 DIAGNOSIS — M8580 Other specified disorders of bone density and structure, unspecified site: Secondary | ICD-10-CM | POA: Diagnosis not present

## 2021-05-30 DIAGNOSIS — E7800 Pure hypercholesterolemia, unspecified: Secondary | ICD-10-CM | POA: Diagnosis not present

## 2021-05-30 DIAGNOSIS — F4321 Adjustment disorder with depressed mood: Secondary | ICD-10-CM | POA: Diagnosis not present

## 2021-05-30 DIAGNOSIS — K5732 Diverticulitis of large intestine without perforation or abscess without bleeding: Secondary | ICD-10-CM

## 2021-05-30 NOTE — Patient Instructions (Signed)
I will call blood test results. ?Please get back to exercising. ?I hope the spring is better than your winter.  ?

## 2021-05-31 ENCOUNTER — Encounter: Payer: Self-pay | Admitting: Family Medicine

## 2021-05-31 NOTE — Assessment & Plan Note (Signed)
Recheck Direct LDL ?

## 2021-05-31 NOTE — Progress Notes (Signed)
? ? ?  SUBJECTIVE:  ? ?CHIEF COMPLAINT / HPI:  ? ?FU several issues.   ?Not good since Christmas for two reasons: ?Had COVID.  Slow to recover.  Finally feeling back to normal ?Fell and injured right knee.  Xrays fine.  Again, slow to recover and beginning to feel normal. ?Low TSH.  No workup yet.  Needs recheck. ?Interesting bone density.  Excellent bone density at hips.  Significant osteopenia at wrist.  Denies symptoms.  No fracture.  Las bone density was 08/2018.   ?Family concerns:  Son is squabbling over money.  Daughter has "heart problems."  This is causing stress.  She feels she is handling well ?High LDL, resistent to statins in the past.  She wants natural treatment.  Due for recheck. ? ? ? ?OBJECTIVE:  ? ?BP 138/78   Pulse 74   Ht '5\' 5"'$  (1.651 m)   Wt 176 lb 4 oz (79.9 kg)   SpO2 97%   BMI 29.33 kg/m?   ?Neck, no thyromegally ?Lungs clear ?Cardiac RRR without m or g ?Abd benign. ?Right knee normal. ? ?ASSESSMENT/PLAN:  ? ?No problem-specific Assessment & Plan notes found for this encounter. ?  ? ? ?Zenia Resides, MD ?[place]  ?

## 2021-05-31 NOTE — Assessment & Plan Note (Signed)
Because repeat TSH still low, will WU starting with thyroid uptake scan. ?

## 2021-05-31 NOTE — Assessment & Plan Note (Signed)
She is looking forward to being more active this spring.  Agree with increased exercise.  No medications indicated at this time. ?

## 2021-06-04 ENCOUNTER — Telehealth: Payer: Self-pay | Admitting: *Deleted

## 2021-06-04 NOTE — Telephone Encounter (Signed)
Contacted pt and gave her the appointment time for the imaging she is having done.  Mailed appointment reminders as well.Tammy Castro, CMA ? ?

## 2021-06-07 LAB — CBC
Hematocrit: 41.8 % (ref 34.0–46.6)
Hemoglobin: 13.4 g/dL (ref 11.1–15.9)
MCH: 28.8 pg (ref 26.6–33.0)
MCHC: 32.1 g/dL (ref 31.5–35.7)
MCV: 90 fL (ref 79–97)
Platelets: 343 10*3/uL (ref 150–450)
RBC: 4.66 x10E6/uL (ref 3.77–5.28)
RDW: 13.6 % (ref 11.7–15.4)
WBC: 5.6 10*3/uL (ref 3.4–10.8)

## 2021-06-07 LAB — CMP14+EGFR
ALT: 23 IU/L (ref 0–32)
AST: 24 IU/L (ref 0–40)
Albumin/Globulin Ratio: 1.7 (ref 1.2–2.2)
Albumin: 4.3 g/dL (ref 3.7–4.7)
Alkaline Phosphatase: 65 IU/L (ref 44–121)
BUN/Creatinine Ratio: 32 — ABNORMAL HIGH (ref 12–28)
BUN: 23 mg/dL (ref 8–27)
Bilirubin Total: 0.3 mg/dL (ref 0.0–1.2)
CO2: 22 mmol/L (ref 20–29)
Calcium: 9.4 mg/dL (ref 8.7–10.3)
Chloride: 106 mmol/L (ref 96–106)
Creatinine, Ser: 0.72 mg/dL (ref 0.57–1.00)
Globulin, Total: 2.6 g/dL (ref 1.5–4.5)
Glucose: 102 mg/dL — ABNORMAL HIGH (ref 70–99)
Potassium: 5.2 mmol/L (ref 3.5–5.2)
Sodium: 143 mmol/L (ref 134–144)
Total Protein: 6.9 g/dL (ref 6.0–8.5)
eGFR: 89 mL/min/{1.73_m2} (ref >59)

## 2021-06-07 LAB — TSH: TSH: 0.305 u[IU]/mL — ABNORMAL LOW (ref 0.450–4.500)

## 2021-06-07 LAB — LDL CHOLESTEROL, DIRECT: LDL Direct: 130 mg/dL — ABNORMAL HIGH (ref 0–99)

## 2021-06-12 ENCOUNTER — Ambulatory Visit (INDEPENDENT_AMBULATORY_CARE_PROVIDER_SITE_OTHER): Payer: Medicare HMO

## 2021-06-12 DIAGNOSIS — M8580 Other specified disorders of bone density and structure, unspecified site: Secondary | ICD-10-CM

## 2021-06-12 DIAGNOSIS — Z780 Asymptomatic menopausal state: Secondary | ICD-10-CM | POA: Diagnosis not present

## 2021-06-12 DIAGNOSIS — M8588 Other specified disorders of bone density and structure, other site: Secondary | ICD-10-CM | POA: Diagnosis not present

## 2021-06-12 DIAGNOSIS — M85832 Other specified disorders of bone density and structure, left forearm: Secondary | ICD-10-CM | POA: Diagnosis not present

## 2021-06-14 ENCOUNTER — Telehealth: Payer: Self-pay

## 2021-06-14 NOTE — Telephone Encounter (Signed)
Tammy Castro with Nuclear Medicine at [facility] calls nurse line requesting to speak with PCP in regards to thyroid imaging order.  ? ? ?[phone] ?

## 2021-06-14 NOTE — Telephone Encounter (Signed)
Called.  I had ordered the wrong uptake scan.  We agreed on correct scan and Waunita Schooner will correct the order. ?

## 2021-06-19 ENCOUNTER — Encounter (HOSPITAL_COMMUNITY): Admission: RE | Admit: 2021-06-19 | Payer: Medicare HMO | Source: Ambulatory Visit

## 2021-06-19 ENCOUNTER — Encounter (HOSPITAL_COMMUNITY)
Admission: RE | Admit: 2021-06-19 | Discharge: 2021-06-19 | Disposition: A | Discharge status: Home / Self Care | Payer: Medicare HMO | Source: Ambulatory Visit | Attending: Family Medicine | Admitting: Family Medicine

## 2021-06-19 DIAGNOSIS — R7989 Other specified abnormal findings of blood chemistry: Secondary | ICD-10-CM | POA: Insufficient documentation

## 2021-06-19 MED ORDER — SODIUM IODIDE I 131 CAPSULE
16.4000 | Freq: Once | INTRAVENOUS | Status: AC | PRN
  Administered 2021-06-19: 16.4 via ORAL

## 2021-06-20 ENCOUNTER — Encounter (HOSPITAL_COMMUNITY)
Admission: RE | Admit: 2021-06-20 | Discharge: 2021-06-20 | Disposition: A | Discharge status: Home / Self Care | Payer: Medicare HMO | Source: Ambulatory Visit | Attending: Family Medicine | Admitting: Family Medicine

## 2021-06-20 DIAGNOSIS — E041 Nontoxic single thyroid nodule: Secondary | ICD-10-CM | POA: Diagnosis not present

## 2021-06-20 MED ORDER — SODIUM PERTECHNETATE TC 99M INJECTION
4.9000 | Freq: Once | INTRAVENOUS | Status: AC | PRN
  Administered 2021-06-20: 4.9 via INTRAVENOUS

## 2021-06-21 ENCOUNTER — Telehealth: Payer: Self-pay | Admitting: Family Medicine

## 2021-06-21 ENCOUNTER — Other Ambulatory Visit: Payer: Self-pay | Admitting: Family Medicine

## 2021-06-21 DIAGNOSIS — E041 Nontoxic single thyroid nodule: Secondary | ICD-10-CM

## 2021-06-21 NOTE — Telephone Encounter (Signed)
Patient returns call to nurse line.  ? ?Patient advised of thyroid US.  ? ?Patient asks we schedule this in [place].  ? ?I called over there and left message to return my call to schedule patients apt. ?

## 2021-06-21 NOTE — Telephone Encounter (Signed)
Called and left message that she needs a follow up thyroid ultrasound, which I have ordered. ?

## 2021-06-21 NOTE — Telephone Encounter (Signed)
Scheduled for 5/8 at '@9am'$ . ? ?The patient has been notified.  ?

## 2021-06-21 NOTE — Assessment & Plan Note (Signed)
ultrasound

## 2021-06-24 ENCOUNTER — Ambulatory Visit (INDEPENDENT_AMBULATORY_CARE_PROVIDER_SITE_OTHER): Payer: Medicare HMO

## 2021-06-24 DIAGNOSIS — E041 Nontoxic single thyroid nodule: Secondary | ICD-10-CM

## 2021-06-25 ENCOUNTER — Telehealth: Payer: Self-pay | Admitting: Family Medicine

## 2021-06-25 DIAGNOSIS — E041 Nontoxic single thyroid nodule: Secondary | ICD-10-CM

## 2021-06-25 NOTE — Telephone Encounter (Signed)
Called patient and explained that she had a cold thyroid nodule and the next step in work up was a FNA to ensure the nodule is not a thyroid cancer.  After a considerable explanation, she is willing to undergo an FNA.  Placed consult to IR for FNA ?

## 2021-06-26 ENCOUNTER — Other Ambulatory Visit: Payer: Self-pay | Admitting: Family Medicine

## 2021-06-26 DIAGNOSIS — E041 Nontoxic single thyroid nodule: Secondary | ICD-10-CM

## 2021-06-26 MED ORDER — LORAZEPAM 1 MG PO TABS: ORAL_TABLET | ORAL | 0 refills | Status: DC

## 2021-06-26 NOTE — Telephone Encounter (Signed)
Patient calls nurse line in regards to upcoming imaging.  ? ?Patient is requesting something to calm her nerves on the day of test.  ? ?Will forward to PCP.  ?

## 2021-06-26 NOTE — Telephone Encounter (Signed)
Verified patient would have someone drive her to the procedure.  Rx sent as requested ?

## 2021-06-26 NOTE — Addendum Note (Signed)
Addended by: Zenia Resides on: 06/26/2021 11:01 AM ? ? Modules accepted: Orders ? ?

## 2021-07-09 ENCOUNTER — Other Ambulatory Visit (HOSPITAL_COMMUNITY)
Admission: RE | Admit: 2021-07-09 | Discharge: 2021-07-09 | Disposition: A | Discharge status: Home / Self Care | Payer: Medicare HMO | Source: Ambulatory Visit | Attending: Family Medicine | Admitting: Family Medicine

## 2021-07-09 ENCOUNTER — Ambulatory Visit
Admission: RE | Admit: 2021-07-09 | Discharge: 2021-07-09 | Disposition: A | Discharge status: Home / Self Care | Payer: Medicare HMO | Source: Ambulatory Visit | Attending: Family Medicine | Admitting: Family Medicine

## 2021-07-09 DIAGNOSIS — E041 Nontoxic single thyroid nodule: Secondary | ICD-10-CM | POA: Insufficient documentation

## 2021-07-10 ENCOUNTER — Telehealth: Payer: Self-pay | Admitting: Family Medicine

## 2021-07-10 LAB — CYTOLOGY - NON PAP

## 2021-07-10 NOTE — Telephone Encounter (Signed)
Called patient and given results, benign follicular nodule.  No further wu needed.  Recheck tSH in 3 months.

## 2021-10-14 ENCOUNTER — Ambulatory Visit: Payer: Self-pay

## 2021-10-14 NOTE — Patient Outreach (Signed)
  Care Coordination   Initial Visit Note   10/14/2021 Name: Tammy Castro MRN: [id number] DOB: June 02, 1950  Tammy Castro is a 71 y.o. year old female who sees Hensel, Jamal Collin, MD for primary care. I spoke with  Tammy Castro by phone today.  What matters to the patients health and wellness today?  "I do not need any assistance at this time.  I do need appointment to see Dr. Andria Frames before he retires."    Goals Addressed               This Visit's Progress     Patient states she does not need any assistance with her medical issues (pt-stated)        Care Coordination Interventions: Assessed social determinant of health barriers          SDOH assessments and interventions completed:  Yes  SDOH Interventions Today    Flowsheet Row Most Recent Value  SDOH Interventions   Housing Interventions Intervention Not Indicated  Transportation Interventions Intervention Not Indicated        Care Coordination Interventions Activated:  Yes  Care Coordination Interventions:  Yes, provided   Follow up plan: Referral made to [place] to call patient for appointment    Encounter Outcome:  Pt. Visit Completed

## 2021-10-30 ENCOUNTER — Ambulatory Visit: Payer: Medicare HMO | Admitting: Family Medicine

## 2021-11-11 ENCOUNTER — Ambulatory Visit (INDEPENDENT_AMBULATORY_CARE_PROVIDER_SITE_OTHER): Payer: Medicare HMO | Admitting: Family Medicine

## 2021-11-11 ENCOUNTER — Encounter: Payer: Self-pay | Admitting: Family Medicine

## 2021-11-11 VITALS — BP 151/76 | HR 63 | Temp 98.2°F | Ht 65.0 in | Wt 175.8 lb

## 2021-11-11 DIAGNOSIS — F4321 Adjustment disorder with depressed mood: Secondary | ICD-10-CM

## 2021-11-11 DIAGNOSIS — Z23 Encounter for immunization: Secondary | ICD-10-CM | POA: Diagnosis not present

## 2021-11-11 DIAGNOSIS — R030 Elevated blood-pressure reading, without diagnosis of hypertension: Secondary | ICD-10-CM

## 2021-11-11 NOTE — Patient Instructions (Addendum)
See me in early December.  If your blood pressure is still high, I will start you on medicine.   Colonoscopy will be due for a colonoscopy in April 2024 I recommend Dr. Beatrice Lecher in [place].  She is great. I plan to retire in February.

## 2021-11-12 ENCOUNTER — Encounter: Payer: Self-pay | Admitting: Family Medicine

## 2021-11-12 NOTE — Assessment & Plan Note (Signed)
Patient strongly wants to give lifestyle modifications a try.  Recheck in 2 months.  If still elevated, stop meds.
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
## 2021-11-12 NOTE — Assessment & Plan Note (Signed)
Doing well coping with many life changes.

## 2021-11-21 DIAGNOSIS — R200 Anesthesia of skin: Secondary | ICD-10-CM | POA: Diagnosis not present

## 2021-11-21 DIAGNOSIS — R42 Dizziness and giddiness: Secondary | ICD-10-CM | POA: Diagnosis not present

## 2021-11-21 DIAGNOSIS — G319 Degenerative disease of nervous system, unspecified: Secondary | ICD-10-CM | POA: Diagnosis not present

## 2021-11-21 DIAGNOSIS — Z9181 History of falling: Secondary | ICD-10-CM | POA: Diagnosis not present

## 2021-11-21 DIAGNOSIS — I10 Essential (primary) hypertension: Secondary | ICD-10-CM | POA: Diagnosis not present

## 2021-11-21 DIAGNOSIS — I959 Hypotension, unspecified: Secondary | ICD-10-CM | POA: Diagnosis not present

## 2021-11-21 DIAGNOSIS — Z96641 Presence of right artificial hip joint: Secondary | ICD-10-CM | POA: Diagnosis not present

## 2021-11-21 DIAGNOSIS — H5500 Unspecified nystagmus: Secondary | ICD-10-CM | POA: Diagnosis not present

## 2021-11-21 DIAGNOSIS — R531 Weakness: Secondary | ICD-10-CM | POA: Diagnosis not present

## 2021-11-21 DIAGNOSIS — E7800 Pure hypercholesterolemia, unspecified: Secondary | ICD-10-CM | POA: Diagnosis not present

## 2021-11-21 DIAGNOSIS — J9811 Atelectasis: Secondary | ICD-10-CM | POA: Diagnosis not present

## 2021-11-21 DIAGNOSIS — I6781 Acute cerebrovascular insufficiency: Secondary | ICD-10-CM | POA: Diagnosis not present

## 2021-11-21 DIAGNOSIS — G459 Transient cerebral ischemic attack, unspecified: Secondary | ICD-10-CM | POA: Diagnosis not present

## 2021-11-22 DIAGNOSIS — I10 Essential (primary) hypertension: Secondary | ICD-10-CM | POA: Diagnosis not present

## 2021-11-22 DIAGNOSIS — E7800 Pure hypercholesterolemia, unspecified: Secondary | ICD-10-CM | POA: Diagnosis not present

## 2021-11-22 DIAGNOSIS — I081 Rheumatic disorders of both mitral and tricuspid valves: Secondary | ICD-10-CM | POA: Diagnosis not present

## 2021-11-22 DIAGNOSIS — G459 Transient cerebral ischemic attack, unspecified: Secondary | ICD-10-CM | POA: Diagnosis not present

## 2021-12-05 ENCOUNTER — Ambulatory Visit: Payer: Medicare HMO | Admitting: Family Medicine

## 2021-12-10 ENCOUNTER — Telehealth: Payer: Self-pay

## 2021-12-10 DIAGNOSIS — G459 Transient cerebral ischemic attack, unspecified: Secondary | ICD-10-CM | POA: Insufficient documentation

## 2021-12-10 DIAGNOSIS — R030 Elevated blood-pressure reading, without diagnosis of hypertension: Secondary | ICD-10-CM

## 2021-12-10 DIAGNOSIS — Z8673 Personal history of transient ischemic attack (TIA), and cerebral infarction without residual deficits: Secondary | ICD-10-CM | POA: Insufficient documentation

## 2021-12-10 MED ORDER — ATORVASTATIN CALCIUM 40 MG PO TABS: 40.0000 mg | ORAL_TABLET | Freq: Every day | ORAL | 3 refills | Status: DC

## 2021-12-10 MED ORDER — LISINOPRIL 5 MG PO TABS: 5.0000 mg | ORAL_TABLET | Freq: Every day | ORAL | 3 refills | Status: DC

## 2021-12-10 NOTE — Telephone Encounter (Signed)
Was hospitalized at [facility] for TIA.  Reviewed record in care everywhere.  Yes,  Atorvastatin and lisinopril reasonable.  LM for patient that Rx sent.

## 2021-12-10 NOTE — Telephone Encounter (Signed)
Patient calls nurse line requesting refills on Lisinopril '5mg'$  and Atorvastatin '40mg'$ .  Patient reports she was recently seen in ED at [facility] and prescribed these medications. Patient reports she is going to run out on Saturday.   Patient has an apt with PCP scheduled for 11/20.  Will forward to PCP to advise on medication refills.

## 2021-12-23 ENCOUNTER — Ambulatory Visit: Payer: Medicare HMO | Admitting: Family Medicine

## 2021-12-25 DIAGNOSIS — R7303 Prediabetes: Secondary | ICD-10-CM | POA: Diagnosis not present

## 2021-12-25 DIAGNOSIS — I10 Essential (primary) hypertension: Secondary | ICD-10-CM | POA: Diagnosis not present

## 2021-12-25 DIAGNOSIS — E7800 Pure hypercholesterolemia, unspecified: Secondary | ICD-10-CM | POA: Diagnosis not present

## 2021-12-25 DIAGNOSIS — G459 Transient cerebral ischemic attack, unspecified: Secondary | ICD-10-CM | POA: Diagnosis not present

## 2022-01-06 ENCOUNTER — Ambulatory Visit (INDEPENDENT_AMBULATORY_CARE_PROVIDER_SITE_OTHER): Payer: Medicare HMO | Admitting: Family Medicine

## 2022-01-06 ENCOUNTER — Encounter: Payer: Self-pay | Admitting: Family Medicine

## 2022-01-06 VITALS — BP 118/66 | HR 72 | Ht 65.0 in | Wt 173.2 lb

## 2022-01-06 DIAGNOSIS — Z8673 Personal history of transient ischemic attack (TIA), and cerebral infarction without residual deficits: Secondary | ICD-10-CM | POA: Diagnosis not present

## 2022-01-06 DIAGNOSIS — I10 Essential (primary) hypertension: Secondary | ICD-10-CM | POA: Diagnosis not present

## 2022-01-06 DIAGNOSIS — E7800 Pure hypercholesterolemia, unspecified: Secondary | ICD-10-CM | POA: Diagnosis not present
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
## 2022-01-06 NOTE — Assessment & Plan Note (Signed)
On appropriate meds and motivated to take them
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
## 2022-01-06 NOTE — Assessment & Plan Note (Signed)
Lipid panel results are at goal.

## 2022-02-11 DIAGNOSIS — H524 Presbyopia: Secondary | ICD-10-CM | POA: Diagnosis not present

## 2022-02-11 DIAGNOSIS — Z0100 Encounter for examination of eyes and vision without abnormal findings: Secondary | ICD-10-CM | POA: Diagnosis not present

## 2022-03-25 ENCOUNTER — Encounter: Payer: Self-pay | Admitting: Family Medicine

## 2022-03-31 ENCOUNTER — Other Ambulatory Visit: Payer: Self-pay | Admitting: Family Medicine

## 2022-03-31 DIAGNOSIS — Z1231 Encounter for screening mammogram for malignant neoplasm of breast: Secondary | ICD-10-CM

## 2022-05-01 IMAGING — DX DG KNEE COMPLETE 4+V*R*
4 series · 4 of 4 positions shown · non-contrast
Comparison: None.

CLINICAL DATA: pain after fall

EXAM:
RIGHT KNEE - COMPLETE 4+ VIEW

[knee ap]
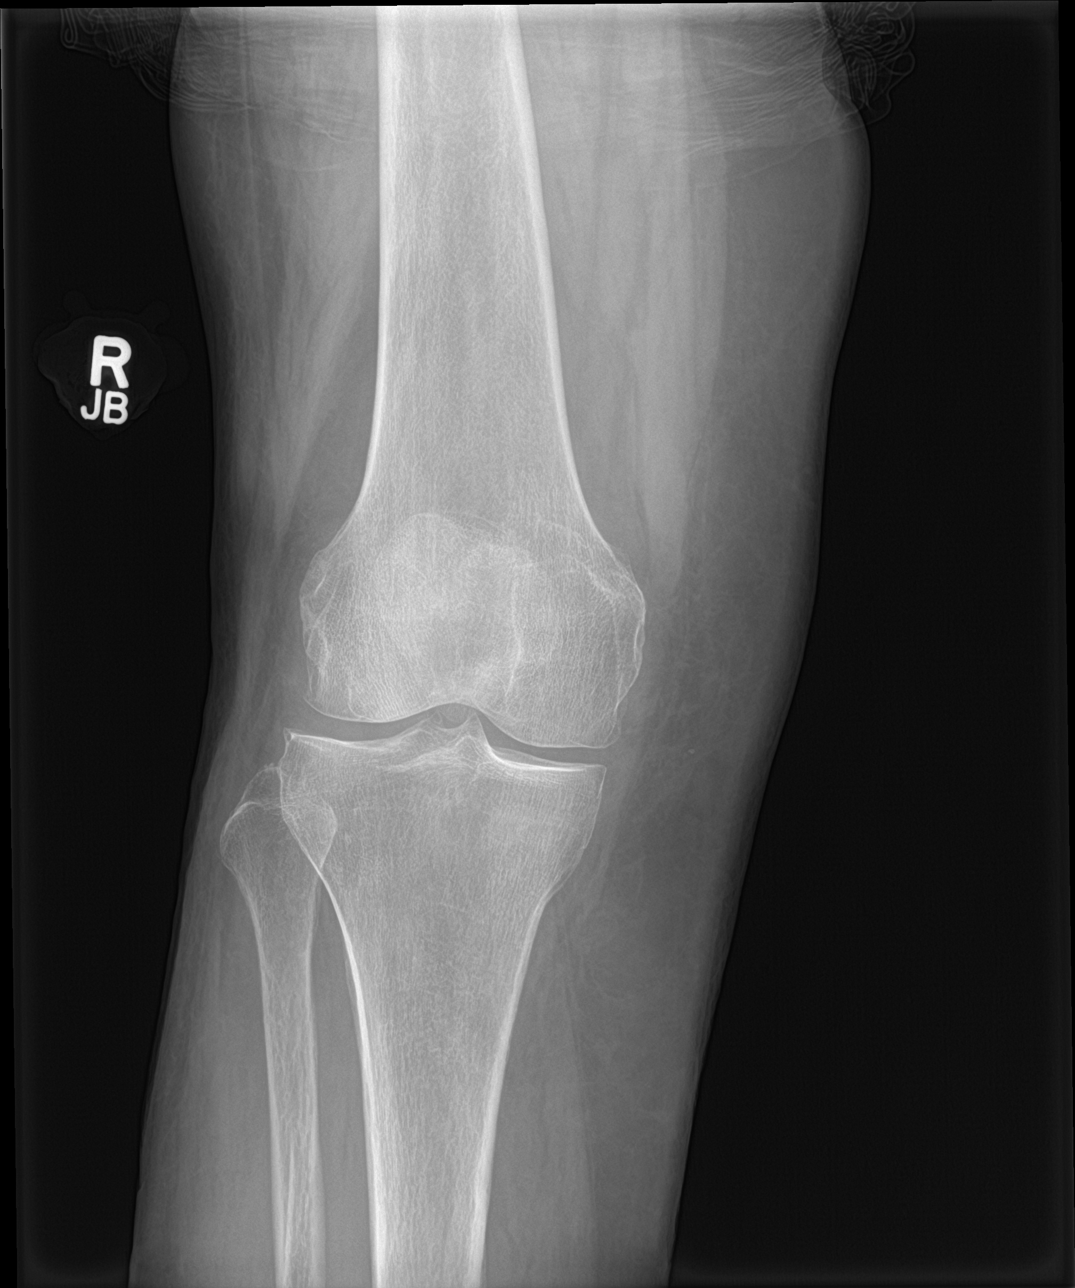

[knee lat]
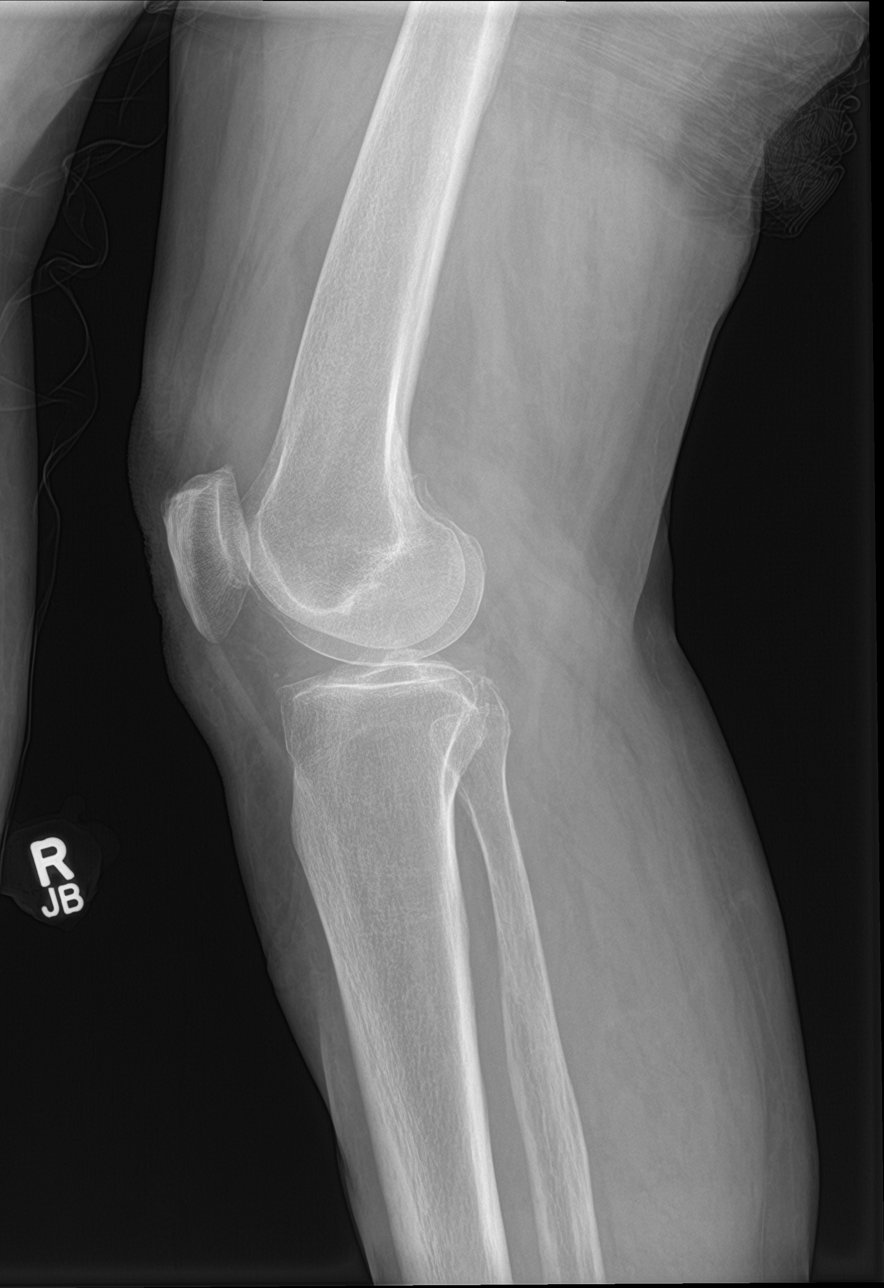

[knee obl (1 of 2)]
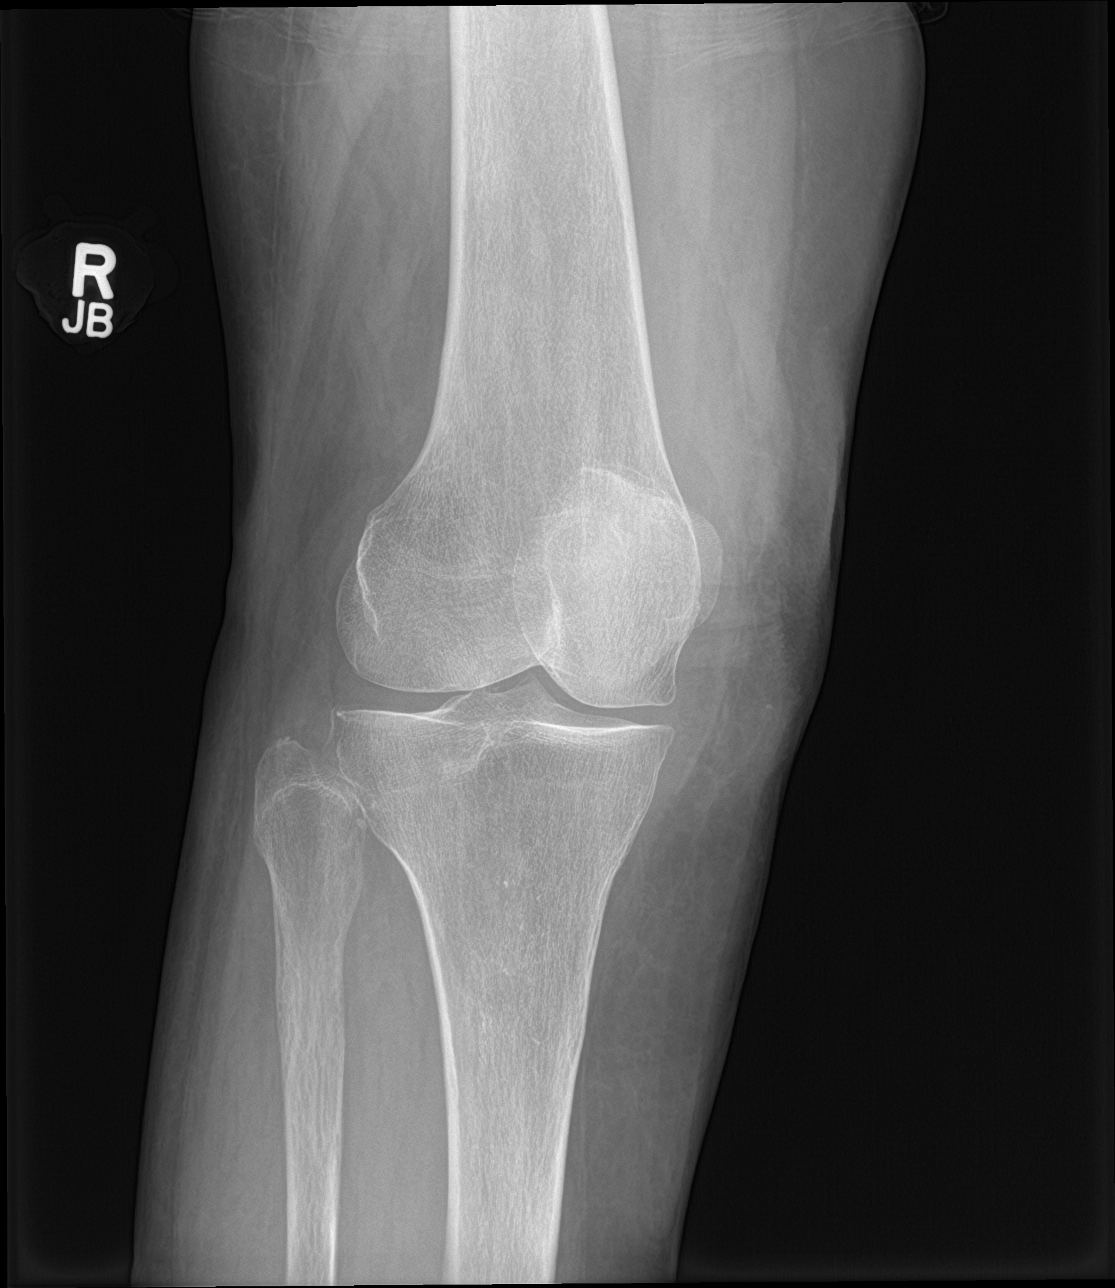

[knee obl (2 of 2)]
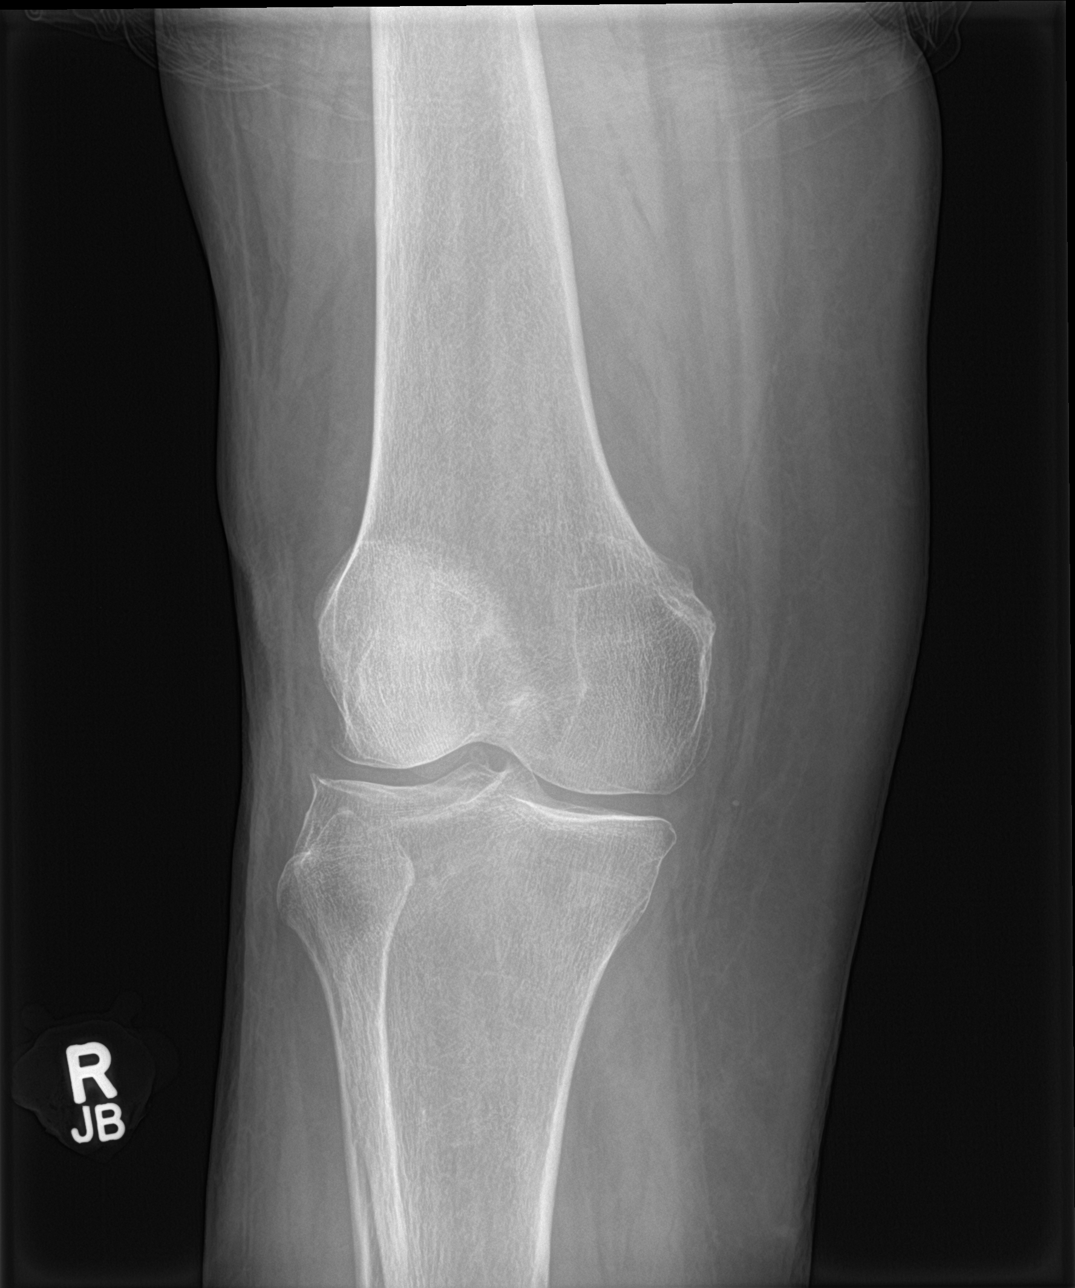

[4 of 4 positions shown; findings below may reference images not displayed]

FINDINGS: No evidence of fracture or dislocation. Small effusion. No evidence
of arthropathy or other focal bone abnormality. Soft tissues are
unremarkable.
IMPRESSION: 1. Small joint effusion.
2.  No acute displaced fracture or dislocation.

## 2022-05-05 ENCOUNTER — Telehealth: Payer: Self-pay | Admitting: Family Medicine

## 2022-05-05 NOTE — Telephone Encounter (Signed)
Patient called to reschedule appointment she is a new patient can you verify if you're taking her on as a new patient

## 2022-05-06 NOTE — Telephone Encounter (Signed)
I am not sure.  If she related to someone that I have seen?

## 2022-05-07 ENCOUNTER — Ambulatory Visit (INDEPENDENT_AMBULATORY_CARE_PROVIDER_SITE_OTHER): Payer: Medicare HMO

## 2022-05-07 DIAGNOSIS — Z1231 Encounter for screening mammogram for malignant neoplasm of breast: Secondary | ICD-10-CM

## 2022-05-07 NOTE — Telephone Encounter (Signed)
OK to schedule with me. She used ot see Dr. Andria Frames. Thank u

## 2022-05-20 ENCOUNTER — Ambulatory Visit: Payer: Medicare HMO | Admitting: Family Medicine

## 2022-05-20 DIAGNOSIS — Z800 Family history of malignant neoplasm of digestive organs: Secondary | ICD-10-CM | POA: Diagnosis not present

## 2022-05-20 DIAGNOSIS — Z09 Encounter for follow-up examination after completed treatment for conditions other than malignant neoplasm: Secondary | ICD-10-CM | POA: Diagnosis not present

## 2022-05-20 DIAGNOSIS — K5730 Diverticulosis of large intestine without perforation or abscess without bleeding: Secondary | ICD-10-CM | POA: Diagnosis not present

## 2022-05-20 DIAGNOSIS — Z1211 Encounter for screening for malignant neoplasm of colon: Secondary | ICD-10-CM | POA: Diagnosis not present

## 2022-05-20 DIAGNOSIS — Z86010 Personal history of colonic polyps: Secondary | ICD-10-CM | POA: Diagnosis not present

## 2022-05-20 LAB — HM COLONOSCOPY

## 2022-06-11 ENCOUNTER — Encounter: Payer: Self-pay | Admitting: Family Medicine

## 2022-06-11 ENCOUNTER — Ambulatory Visit (INDEPENDENT_AMBULATORY_CARE_PROVIDER_SITE_OTHER): Payer: Medicare HMO | Admitting: Family Medicine

## 2022-06-11 VITALS — BP 131/71 | HR 78 | Ht 65.0 in | Wt 172.0 lb

## 2022-06-11 DIAGNOSIS — Z23 Encounter for immunization: Secondary | ICD-10-CM

## 2022-06-11 DIAGNOSIS — I10 Essential (primary) hypertension: Secondary | ICD-10-CM

## 2022-06-11 DIAGNOSIS — R7301 Impaired fasting glucose: Secondary | ICD-10-CM

## 2022-06-11 DIAGNOSIS — E7800 Pure hypercholesterolemia, unspecified: Secondary | ICD-10-CM | POA: Diagnosis not present

## 2022-06-11 DIAGNOSIS — Z8673 Personal history of transient ischemic attack (TIA), and cerebral infarction without residual deficits: Secondary | ICD-10-CM

## 2022-06-11 DIAGNOSIS — R7989 Other specified abnormal findings of blood chemistry: Secondary | ICD-10-CM

## 2022-06-11 DIAGNOSIS — R7309 Other abnormal glucose: Secondary | ICD-10-CM | POA: Diagnosis not present

## 2022-06-11 NOTE — Progress Notes (Signed)
Patient is transferring from Dr. Leveda Anna that retired from [facility] in [place]

## 2022-06-11 NOTE — Progress Notes (Unsigned)
New Patient Office Visit  Subjective    Patient ID: Tammy Castro, female    DOB: January 22, 1951  Age: 72 y.o. MRN: [id number]  CC:  Chief Complaint  Patient presents with   Establish Care    HPI Tammy Castro presents to establish care She was previously followed by Dr. Tivis Ringer who recently retired in [place].  She had had a previous hospitalization for TIA at [facility].  She is now taking blood pressure medication and atorvastatin regularly.  Last LDL was 45 so at goal for less than 70.  Reports that she actually just had her colonoscopy updated a few weeks ago at [facility].  She actually had a great report and was told that she could go as far out as 5 years but says she plans to go back in 3.  Outpatient Encounter Medications as of 06/11/2022  Medication Sig   aspirin EC 81 MG tablet Take 81 mg by mouth daily. Swallow whole.   atorvastatin (LIPITOR) 40 MG tablet Take 1 tablet (40 mg total) by mouth daily.   calcium-vitamin D (OSCAL WITH D 500-200) 500-200 MG-UNIT per tablet Take 1 tablet by mouth 2 (two) times daily.     COCONUT OIL PO Take 1 Scoop by mouth daily.   Collagen Hydrolysate POWD Take 1 Scoop by mouth daily.   Lactobacillus (PROBIOTIC ACIDOPHILUS PO) Take 250 mg by mouth daily.   Lecithin 400 MG CAPS Take 400 mg by mouth daily.   lisinopril (ZESTRIL) 5 MG tablet Take 1 tablet (5 mg total) by mouth at bedtime.   Magnesium 400 MG CAPS Take 400 mg by mouth daily.    Melatonin 10 MG CAPS Take 20 mg by mouth at bedtime.   Multiple Vitamins-Minerals (COMPLETE MULTIVITAMIN/MINERAL PO) Take 1 tablet by mouth once a day.    Nutritional Supplements (LIVER DEFENSE PO) Take 1 tablet by mouth daily.    Omega 3-6-9 Fatty Acids (OMEGA-3 & OMEGA-6 FISH OIL) CAPS Take 1 capsule by mouth 2 (two) times daily.   vitamin E 1000 UNIT capsule Take 1,000 Units by mouth daily.   VITAMIN K, PHYTONADIONE, PO Take 1 tablet by mouth daily.    [DISCONTINUED] ciclopirox  (LOPROX) 0.77 % SUSP Apply nail lacquer 8% solution once daily to affected nails with applicator brush, preferably at bedtime or 8 hours before washing; remove with alcohol every 7 days (Patient not taking: Reported on 04/10/2021)   [DISCONTINUED] LORazepam (ATIVAN) 1 MG tablet Take one pill 60 minutes prior to the procedure.  If needed, take a second pill 15 minutes prior to procedure.   No facility-administered encounter medications on file as of 06/11/2022.    Past Medical History:  Diagnosis Date   Arthritis    Cancer    Left nostril   Complication of anesthesia    Diverticulosis    Osteoarthritis of hip    Right   PONV (postoperative nausea and vomiting)    Can not remember the medicine used, but it was during the breast reduction surgery.   Pre-diabetes    Thyroid disease    Toxemia in pregnancy    Varicose veins of legs    surgery scheduled for  07/01/11    Past Surgical History:  Procedure Laterality Date   APPENDECTOMY     BREAST BIOPSY Right    BREAST REDUCTION SURGERY Bilateral    COLON SURGERY     COLONOSCOPY     INGUINAL HERNIA REPAIR     right side  KNEE ARTHROSCOPY     Left   REDUCTION MAMMAPLASTY     Ruptured colon     In 1995   THYROID SURGERY     right side   TOTAL HIP ARTHROPLASTY Right 08/25/2017   Procedure: RIGHT TOTAL HIP ARTHROPLASTY ANTERIOR APPROACH;  Surgeon: Kathryne Hitch, MD;  Location: [facility];  Service: Orthopedics;  Laterality: Right;    Family History  Problem Relation Age of Onset   Colon polyps Mother    Cancer Mother    Alzheimer's disease Father    Alcohol abuse Sister    Multiple sclerosis Sister    Parkinson's disease Brother    Alcohol abuse Brother    Alcohol abuse Brother    Heart disease Daughter    Heart disease Son     Social History   Socioeconomic History   Marital status: Widowed    Spouse name: Not on file   Number of children: 2   Years of education: 54   Highest education level: 12th grade   Occupational History   Not on file  Tobacco Use   Smoking status: Never    Passive exposure: Never   Smokeless tobacco: Never  Vaping Use   Vaping Use: Never used  Substance and Sexual Activity   Alcohol use: No    Comment: hx of occ use   Drug use: No   Sexual activity: Not Currently  Other Topics Concern   Not on file  Social History Narrative   Patient lives alone with her cat Musta.   Patient is a widow.   Patient has one son and one daughter.   Patient has been doing Tai Chi, walking at parks and swimming for exercise.   Patient is losing weight and very happy about this.     Social Determinants of Health   Financial Resource Strain: Low Risk  (04/10/2021)   Overall Financial Resource Strain (CARDIA)    Difficulty of Paying Living Expenses: Not hard at all  Food Insecurity: No Food Insecurity (04/10/2021)   Hunger Vital Sign    Worried About Running Out of Food in the Last Year: Never true    Ran Out of Food in the Last Year: Never true  Transportation Needs: No Transportation Needs (10/14/2021)   PRAPARE - Administrator, Civil Service (Medical): No    Lack of Transportation (Non-Medical): No  Physical Activity: Sufficiently Active (04/10/2021)   Exercise Vital Sign    Days of Exercise per Week: 3 days    Minutes of Exercise per Session: 60 min  Stress: Stress Concern Present (04/10/2021)   [employer] of Occupational Health - Occupational Stress Questionnaire    Feeling of Stress : To some extent  Social Connections: Moderately Integrated (04/10/2021)   Social Connection and Isolation Panel [NHANES]    Frequency of Communication with Friends and Family: More than three times a week    Frequency of Social Gatherings with Friends and Family: More than three times a week    Attends Religious Services: More than 4 times per year    Active Member of Clubs or Organizations: No    Attends Engineer, structural: More than 4 times per year     Marital Status: Widowed  Intimate Partner Violence: Not At Risk (04/10/2021)   Humiliation, Afraid, Rape, and Kick questionnaire    Fear of Current or Ex-Partner: No    Emotionally Abused: No    Physically Abused: No    Sexually Abused: No  ROS      Objective    BP 131/71   Pulse 78   Ht  (1.651 m)   Wt 172 lb (78 kg)   SpO2 97%   BMI 28.62 kg/m   Physical Exam Vitals and nursing note reviewed.  Constitutional:      Appearance: She is well-developed.  HENT:     Head: Normocephalic and atraumatic.  Cardiovascular:     Rate and Rhythm: Normal rate and regular rhythm.     Heart sounds: Normal heart sounds.  Pulmonary:     Effort: Pulmonary effort is normal.     Breath sounds: Normal breath sounds.  Skin:    General: Skin is warm and dry.  Neurological:     Mental Status: She is alert and oriented to person, place, and time.  Psychiatric:        Behavior: Behavior normal.         Assessment & Plan:   Problem List Items Addressed This Visit       Cardiovascular and Mediastinum   Hypertension - Primary    Blood pressure at goal today.  Continue current regimen.  Due for updated labs.  Plan to follow-up in 6 months.      Relevant Orders   Lipid Panel w/reflex Direct LDL (Completed)   COMPLETE METABOLIC PANEL WITH GFR (Completed)   CBC (Completed)   TSH (Completed)   Hemoglobin A1c (Completed)     Endocrine   IFG (impaired fasting glucose)    Last A1c was just in the prediabetes range.  Plan to recheck again today to see if its improved or still in the prediabetes range.        Other   HYPERCHOLESTEROLEMIA    Continue daily statin.  Due to update CMP and lipid panel otherwise plan to follow-up in 6 months.  On medication for secondary prevention with history of TIA.      Relevant Orders   Lipid Panel w/reflex Direct LDL (Completed)   COMPLETE METABOLIC PANEL WITH GFR (Completed)   CBC (Completed)   TSH (Completed)   Hemoglobin A1c  (Completed)   Hx of TIA (transient ischemic attack) and stroke    Continue daily statin.      Abnormal TSH    Plan to recheck TSH.      Relevant Orders   Lipid Panel w/reflex Direct LDL (Completed)   COMPLETE METABOLIC PANEL WITH GFR (Completed)   CBC (Completed)   TSH (Completed)   Hemoglobin A1c (Completed)   Other Visit Diagnoses     Abnormal glucose       Relevant Orders   Lipid Panel w/reflex Direct LDL (Completed)   COMPLETE METABOLIC PANEL WITH GFR (Completed)   CBC (Completed)   TSH (Completed)   Hemoglobin A1c (Completed)      Prevnar 20 given today.   Return in about 6 months (around 12/11/2022) for Hypertension.   Nani Gasser, MD

## 2022-06-12 DIAGNOSIS — R7301 Impaired fasting glucose: Secondary | ICD-10-CM | POA: Insufficient documentation

## 2022-06-12 LAB — LIPID PANEL W/REFLEX DIRECT LDL
Cholesterol: 189 mg/dL (ref <200)
HDL: 72 mg/dL (ref >=50)
LDL Cholesterol (Calc): 103 mg/dL (calc) — ABNORMAL HIGH
Non-HDL Cholesterol (Calc): 117 mg/dL (calc) (ref <130)
Total CHOL/HDL Ratio: 2.6 (calc) (ref <5.0)
Triglycerides: 54 mg/dL (ref <150)

## 2022-06-12 LAB — CBC
HCT: 42.5 % (ref 35.0–45.0)
Hemoglobin: 13.7 g/dL (ref 11.7–15.5)
MCH: 29.2 pg (ref 27.0–33.0)
MCHC: 32.2 g/dL (ref 32.0–36.0)
MCV: 90.6 fL (ref 80.0–100.0)
MPV: 9.2 fL (ref 7.5–12.5)
Platelets: 390 10*3/uL (ref 140–400)
RBC: 4.69 10*6/uL (ref 3.80–5.10)
RDW: 13 % (ref 11.0–15.0)
WBC: 4.2 10*3/uL (ref 3.8–10.8)

## 2022-06-12 LAB — COMPLETE METABOLIC PANEL WITH GFR
AG Ratio: 1.6 (calc) (ref 1.0–2.5)
ALT: 30 U/L — ABNORMAL HIGH (ref 6–29)
AST: 33 U/L (ref 10–35)
Albumin: 4.5 g/dL (ref 3.6–5.1)
Alkaline phosphatase (APISO): 74 U/L (ref 37–153)
BUN: 19 mg/dL (ref 7–25)
CO2: 22 mmol/L (ref 20–32)
Calcium: 9.6 mg/dL (ref 8.6–10.4)
Chloride: 105 mmol/L (ref 98–110)
Creat: 0.73 mg/dL (ref 0.60–1.00)
Globulin: 2.8 g/dL (calc) (ref 1.9–3.7)
Glucose, Bld: 98 mg/dL (ref 65–99)
Potassium: 4.6 mmol/L (ref 3.5–5.3)
Sodium: 138 mmol/L (ref 135–146)
Total Bilirubin: 0.5 mg/dL (ref 0.2–1.2)
Total Protein: 7.3 g/dL (ref 6.1–8.1)
eGFR: 87 mL/min/{1.73_m2} (ref >=60)

## 2022-06-12 LAB — HEMOGLOBIN A1C
Hgb A1c MFr Bld: 6 % of total Hgb — ABNORMAL HIGH (ref <5.7)
Mean Plasma Glucose: 126 mg/dL
eAG (mmol/L): 7 mmol/L

## 2022-06-12 LAB — TSH: TSH: 0.34 mIU/L — ABNORMAL LOW (ref 0.40–4.50)

## 2022-06-12 NOTE — Assessment & Plan Note (Signed)
Blood pressure at goal today.  Continue current regimen.  Due for updated labs.  Plan to follow-up in 6 months.

## 2022-06-12 NOTE — Assessment & Plan Note (Signed)
Last A1c was just in the prediabetes range.  Plan to recheck again today to see if its improved or still in the prediabetes range.

## 2022-06-12 NOTE — Assessment & Plan Note (Signed)
Plan to recheck TSH.  

## 2022-06-12 NOTE — Assessment & Plan Note (Signed)
Continue daily statin.  Due to update CMP and lipid panel otherwise plan to follow-up in 6 months.  On medication for secondary prevention with history of TIA.

## 2022-06-12 NOTE — Assessment & Plan Note (Addendum)
Continue daily statin. 

## 2022-06-12 NOTE — Progress Notes (Signed)
Call patient: LDL just a little borderline at 103.  Goal is less than 100.  Continue to work on healthy diet and regular exercise.  Her good cholesterol looks fantastic.  Metabolic panel is normal except the ALT liver enzyme is just borderline.  Like to recheck that in about 6 to 8 weeks.  If taking any excess Tylenol or alcohol products then please cut back.  If not then just continue to eat normally and we will plan to recheck it.  TSH is stable at 0.3 which is where she has been for the last 3 years.  So continue current regimen.  Hemoglobin A1c is 6.0 which is in the prediabetes range.  Continue to work on cutting back on sweets and carbohydrates and eat more vegetables and lean proteins.  Continue to work on regular exercise and plan to recheck A1c again in 6 months for prediabetes.  Your blood count is normal.

## 2022-07-23 DIAGNOSIS — S80262A Insect bite (nonvenomous), left knee, initial encounter: Secondary | ICD-10-CM | POA: Diagnosis not present

## 2022-07-23 DIAGNOSIS — L03116 Cellulitis of left lower limb: Secondary | ICD-10-CM | POA: Diagnosis not present

## 2022-07-23 DIAGNOSIS — W57XXXA Bitten or stung by nonvenomous insect and other nonvenomous arthropods, initial encounter: Secondary | ICD-10-CM | POA: Diagnosis not present

## 2022-09-23 DIAGNOSIS — H524 Presbyopia: Secondary | ICD-10-CM | POA: Diagnosis not present

## 2022-09-23 DIAGNOSIS — Z135 Encounter for screening for eye and ear disorders: Secondary | ICD-10-CM | POA: Diagnosis not present

## 2022-09-23 DIAGNOSIS — H5203 Hypermetropia, bilateral: Secondary | ICD-10-CM | POA: Diagnosis not present

## 2022-09-23 DIAGNOSIS — H25813 Combined forms of age-related cataract, bilateral: Secondary | ICD-10-CM | POA: Diagnosis not present

## 2022-12-11 ENCOUNTER — Encounter: Payer: Self-pay | Admitting: Family Medicine

## 2022-12-11 ENCOUNTER — Ambulatory Visit: Payer: Medicare HMO

## 2022-12-11 ENCOUNTER — Ambulatory Visit (INDEPENDENT_AMBULATORY_CARE_PROVIDER_SITE_OTHER): Payer: Medicare HMO | Admitting: Family Medicine

## 2022-12-11 VITALS — BP 133/79 | HR 70 | Ht 65.0 in | Wt 168.0 lb

## 2022-12-11 DIAGNOSIS — M8580 Other specified disorders of bone density and structure, unspecified site: Secondary | ICD-10-CM | POA: Diagnosis not present

## 2022-12-11 DIAGNOSIS — R0789 Other chest pain: Secondary | ICD-10-CM | POA: Diagnosis not present

## 2022-12-11 DIAGNOSIS — R221 Localized swelling, mass and lump, neck: Secondary | ICD-10-CM

## 2022-12-11 DIAGNOSIS — Z636 Dependent relative needing care at home: Secondary | ICD-10-CM

## 2022-12-11 DIAGNOSIS — R7301 Impaired fasting glucose: Secondary | ICD-10-CM | POA: Diagnosis not present

## 2022-12-11 DIAGNOSIS — I10 Essential (primary) hypertension: Secondary | ICD-10-CM | POA: Diagnosis not present

## 2022-12-11 NOTE — Assessment & Plan Note (Signed)
Well controlled. Continue current regimen. Follow up in  6 mo  

## 2022-12-11 NOTE — Progress Notes (Signed)
Established Patient Office Visit  Subjective   Patient ID: Tammy Castro, female    DOB: 1950/08/22  Age: 72 y.o. MRN: [id number]  Chief Complaint  Patient presents with   Hypertension    HPI  Hypertension- Pt denies chest pain, SOB, dizziness, or heart palpitations.  Taking meds as directed w/o problems.  Denies medication side effects.    Impaired fasting glucose-no increased thirst or urination. No symptoms consistent with hypoglycemia.  Since I last saw her she had an episode where she was watching TV a and got a sudden intense pinching sensation in the left side of her chest.  She says afterwards she felt like a blood rushing sensation.  She said she has had it happen once before she reports that she did have a stress test done a few years ago with Dr. Leveda Castro.  Her aunt who is 25 came to live with her for a while but she said she started to get overwhelmed because it was a 24-hour job.  Unfortunately she suffers from dementia.  She was able to get her into a local nursing home called [facility] but she has been going over there daily to check on her and make sure that she is doing well.  In the interim she has felt personally very stressed and has gained some weight that she is concerned about.  He is also noted some swelling on both sides of her neck above the clavicle.  She says sometimes it looks better and looks like it goes down a little bit and other times it is very swollen.  She has had a thyroidectomy but still has some minimal thyroid function without taking medication.     ROS    Objective:     BP 133/79   Pulse 70   Ht 5\' 5"  (1.651 m)   Wt 168 lb (76.2 kg)   SpO2 95%   BMI 27.96 kg/m     Physical Exam Vitals and nursing note reviewed.  Constitutional:      Appearance: Normal appearance.  HENT:     Head: Normocephalic and atraumatic.  Eyes:     Conjunctiva/sclera: Conjunctivae normal.  Neck:      Comments: Areas of fullness, they are soft  Red  line is old surgical scar from thyroidectomy Cardiovascular:     Rate and Rhythm: Normal rate and regular rhythm.  Pulmonary:     Effort: Pulmonary effort is normal.     Breath sounds: Normal breath sounds.  Skin:    General: Skin is warm and dry.  Neurological:     Mental Status: She is alert.  Psychiatric:        Mood and Affect: Mood normal.     No results found for any visits on 12/11/22.     The 10-year ASCVD risk score (Arnett DK, et al., 2019) is: 18%    Assessment & Plan:   Problem List Items Addressed This Visit       Cardiovascular and Mediastinum   Hypertension - Primary    Well controlled. Continue current regimen. Follow up in  6 mo      Relevant Orders   CMP14+EGFR   Lipid panel   TSH   CBC with Differential/Platelet     Endocrine   IFG (impaired fasting glucose)    Well controlled. Continue current regimen. Follow up in  4mo       Relevant Orders   CMP14+EGFR   Lipid panel   TSH  CBC with Differential/Platelet     Musculoskeletal and Integument   Osteopenia   Relevant Orders   CMP14+EGFR   Lipid panel   TSH   CBC with Differential/Platelet   VITAMIN D 25 Hydroxy (Vit-D Deficiency, Fractures)     Other   Caregiver stress   Other Visit Diagnoses     Atypical chest pain       Relevant Orders   CMP14+EGFR   Lipid panel   TSH   CBC with Differential/Platelet   EKG 12-Lead   Neck swelling       Relevant Orders   US Soft Tissue Head/Neck (NON-THYROID)      Atypical chest pain-EKG today shows rate of 67 bpm, normal sinus rhythm.  No significant change from prior EKG on file from 2019.  Inverted T wave in lead V3 and V2 but it was present previously as well.  The swelling on the lateral sides of her neck it is unusual and she says it does come and go she has had prior neck surgery with her thyroid removed so we will start with an ultrasound we may end up getting a CT if needed but we will start with the ultrasound  first.  Tammy Castro has been under an enormous amount of stress recently.  But she has really put some focus back on herself and her self-care.  She really wants to get off some of the excess weight that she has gained and be more healthy.  No follow-ups on file.   I spent 40 minutes on the day of the encounter to include pre-visit record review, face-to-face time with the patient and post visit ordering of test.   Tammy Gasser, MD

## 2022-12-12 LAB — CMP14+EGFR
ALT: 36 [IU]/L — ABNORMAL HIGH (ref 0–32)
AST: 36 [IU]/L (ref 0–40)
Albumin: 4.3 g/dL (ref 3.8–4.8)
Alkaline Phosphatase: 77 [IU]/L (ref 44–121)
BUN/Creatinine Ratio: 28 (ref 12–28)
BUN: 20 mg/dL (ref 8–27)
Bilirubin Total: 0.3 mg/dL (ref 0.0–1.2)
CO2: 22 mmol/L (ref 20–29)
Calcium: 9.6 mg/dL (ref 8.7–10.3)
Chloride: 105 mmol/L (ref 96–106)
Creatinine, Ser: 0.71 mg/dL (ref 0.57–1.00)
Globulin, Total: 2.9 g/dL (ref 1.5–4.5)
Glucose: 108 mg/dL — ABNORMAL HIGH (ref 70–99)
Potassium: 4.8 mmol/L (ref 3.5–5.2)
Sodium: 140 mmol/L (ref 134–144)
Total Protein: 7.2 g/dL (ref 6.0–8.5)
eGFR: 90 mL/min/{1.73_m2} (ref >59)

## 2022-12-12 LAB — VITAMIN D 25 HYDROXY (VIT D DEFICIENCY, FRACTURES): Vit D, 25-Hydroxy: 35.7 ng/mL (ref 30.0–100.0)

## 2022-12-12 LAB — CBC WITH DIFFERENTIAL/PLATELET
Basophils Absolute: 0 10*3/uL (ref 0.0–0.2)
Basos: 1 %
EOS (ABSOLUTE): 0.1 10*3/uL (ref 0.0–0.4)
Eos: 3 %
Hematocrit: 44 % (ref 34.0–46.6)
Hemoglobin: 13.9 g/dL (ref 11.1–15.9)
Immature Grans (Abs): 0 10*3/uL (ref 0.0–0.1)
Immature Granulocytes: 0 %
Lymphocytes Absolute: 1.5 10*3/uL (ref 0.7–3.1)
Lymphs: 33 %
MCH: 29.3 pg (ref 26.6–33.0)
MCHC: 31.6 g/dL (ref 31.5–35.7)
MCV: 93 fL (ref 79–97)
Monocytes Absolute: 0.3 10*3/uL (ref 0.1–0.9)
Monocytes: 6 %
Neutrophils Absolute: 2.7 10*3/uL (ref 1.4–7.0)
Neutrophils: 57 %
Platelets: 333 10*3/uL (ref 150–450)
RBC: 4.74 x10E6/uL (ref 3.77–5.28)
RDW: 13.5 % (ref 11.7–15.4)
WBC: 4.7 10*3/uL (ref 3.4–10.8)

## 2022-12-12 LAB — LIPID PANEL
Chol/HDL Ratio: 3.3 ratio (ref 0.0–4.4)
Cholesterol, Total: 227 mg/dL — ABNORMAL HIGH (ref 100–199)
HDL: 69 mg/dL (ref >39)
LDL Chol Calc (NIH): 144 mg/dL — ABNORMAL HIGH (ref 0–99)
Triglycerides: 82 mg/dL (ref 0–149)
VLDL Cholesterol Cal: 14 mg/dL (ref 5–40)

## 2022-12-12 LAB — TSH: TSH: 0.289 u[IU]/mL — ABNORMAL LOW (ref 0.450–4.500)

## 2022-12-12 NOTE — Progress Notes (Signed)
Call patient: Metabolic panel overall looks good.  One of the liver enzymes is still just slightly elevated.  This is usually from diet so just encouraged her to continue to work on healthy food choices and regular exercise to reduce inflammation in the liver.  LDL cholesterol is still elevated about 44 points above goal.  Her current 10-year cardiovascular risk score is around 18% this is high enough that I would highly recommend a statin.  Anything greater than 10% and we recommend a statin to reduce risk for heart attack and stroke.  If she is okay with starting that then please let me know.  The thyroid is a little lower than usual normally its around 0.3.  It went to 0.28 this time.  I do want to keep an eye on that and try to check it again in about 6 months instead of a year.  Vitamin D is on the low end of normal so recommend 25 mcg daily.  Blood count is normal.  The 10-year ASCVD risk score (Arnett DK, et al., 2019) is: 18.6%   Values used to calculate the score:     Age: 72 years     Sex: Female     Is Non-Hispanic African American: No     Diabetic: No     Tobacco smoker: No     Systolic Blood Pressure: 133 mmHg     Is BP treated: Yes     HDL Cholesterol: 69 mg/dL     Total Cholesterol: 227 mg/dL

## 2022-12-18 ENCOUNTER — Ambulatory Visit (INDEPENDENT_AMBULATORY_CARE_PROVIDER_SITE_OTHER): Payer: Medicare HMO | Admitting: Family Medicine

## 2022-12-18 ENCOUNTER — Encounter: Payer: Self-pay | Admitting: Family Medicine

## 2022-12-18 VITALS — BP 130/64 | HR 61 | Ht 65.0 in | Wt 168.0 lb

## 2022-12-18 DIAGNOSIS — R195 Other fecal abnormalities: Secondary | ICD-10-CM | POA: Diagnosis not present

## 2022-12-18 NOTE — Progress Notes (Signed)
   Acute Office Visit  Subjective:     Patient ID: Tammy Castro, female    DOB: 09-30-1950, 72 y.o.   MRN: [id number]  No chief complaint on file.   HPI Patient is in today for intermittent loose stools 1 months. Can be green or dark purple (marroon)  at time. No gas or bloating. No changes in her diet or supplement. No blood in stools.   Recent CBC was normal. Loose stools are almost daily.     ROS      Objective:    BP 130/64   Pulse 61   Ht 5\' 5"  (1.651 m)   Wt 168 lb (76.2 kg)   SpO2 98%   BMI 27.96 kg/m    Physical Exam Vitals and nursing note reviewed.  Constitutional:      Appearance: Normal appearance.  HENT:     Head: Normocephalic and atraumatic.  Eyes:     Conjunctiva/sclera: Conjunctivae normal.  Cardiovascular:     Rate and Rhythm: Normal rate and regular rhythm.  Pulmonary:     Effort: Pulmonary effort is normal.     Breath sounds: Normal breath sounds.  Abdominal:     General: Bowel sounds are normal. There is no distension.     Palpations: There is no mass.     Tenderness: There is no abdominal tenderness. There is no guarding.  Skin:    General: Skin is warm and dry.  Neurological:     Mental Status: She is alert.  Psychiatric:        Mood and Affect: Mood normal.     No results found for any visits on 12/18/22.      Assessment & Plan:   Problem List Items Addressed This Visit   None Visit Diagnoses     Loose stools    -  Primary   Relevant Orders   C difficile Toxins A+B W/Rflx   GI Profile, Stool, PCR      Loose stools - will check for infection first.  If negative consider referral back to GI for further workup . Her colonoscopy is up to date but she has a fam hx in first degree relative.  No known triggers. No pain or bloating.      No orders of the defined types were placed in this encounter.   Return if symptoms worsen or fail to improve.  Nani Gasser, MD

## 2022-12-19 DIAGNOSIS — R195 Other fecal abnormalities: Secondary | ICD-10-CM | POA: Diagnosis not present

## 2022-12-22 LAB — GI PROFILE, STOOL, PCR

## 2022-12-22 NOTE — Progress Notes (Signed)
HI Tammy Castro,  They did not see any specific concerns on the ultrasound in the area where you are little bit puffy and swollen.  They did not see any soft tissue cysts or solid masses or swollen lymph nodes.  But they did note that the left side of your thyroid gland is a little bit enlarged, similar to what they saw a year ago when you had the thyroid ultrasound and biopsy done.  They did not note any new findings.

## 2022-12-23 LAB — C DIFFICILE TOXINS A+B W/RFLX: C difficile Toxins A+B, EIA: NEGATIVE

## 2022-12-23 LAB — C DIFFICILE, CYTOTOXIN B

## 2022-12-24 NOTE — Progress Notes (Signed)
Call pt: stool sample was negative for infection. See if willing to see GI?

## 2022-12-25 ENCOUNTER — Telehealth: Payer: Self-pay

## 2022-12-25 DIAGNOSIS — G459 Transient cerebral ischemic attack, unspecified: Secondary | ICD-10-CM

## 2022-12-25 DIAGNOSIS — R195 Other fecal abnormalities: Secondary | ICD-10-CM

## 2022-12-25 DIAGNOSIS — R030 Elevated blood-pressure reading, without diagnosis of hypertension: Secondary | ICD-10-CM

## 2022-12-25 MED ORDER — ATORVASTATIN CALCIUM 80 MG PO TABS: 80.0000 mg | ORAL_TABLET | Freq: Every day | ORAL | 3 refills | Status: DC

## 2022-12-25 MED ORDER — LISINOPRIL 5 MG PO TABS: 5.0000 mg | ORAL_TABLET | Freq: Every day | ORAL | 3 refills | Status: DC

## 2022-12-25 NOTE — Telephone Encounter (Signed)
Tammy Castro is requesting a refill on Lisinopril and atorvastatin. She did have recent labs. She also agreed to the GI referral.  Pended all.

## 2022-12-25 NOTE — Telephone Encounter (Signed)
Would like to actually increase her statin.  Would she be okay with doing that?

## 2022-12-25 NOTE — Progress Notes (Signed)
Would she be ok with me increasing your dose to try to reduce her LDL more?

## 2022-12-25 NOTE — Telephone Encounter (Signed)
Meds ordered this encounter  Medications   lisinopril (ZESTRIL) 5 MG tablet    Sig: Take 1 tablet (5 mg total) by mouth at bedtime.    Dispense:  100 tablet    Refill:  3   atorvastatin (LIPITOR) 80 MG tablet    Sig: Take 1 tablet (80 mg total) by mouth daily.    Dispense:  100 tablet    Refill:  3

## 2022-12-25 NOTE — Telephone Encounter (Signed)
She agreed to the increase in the statin medication. [facility].

## 2022-12-30 DIAGNOSIS — R197 Diarrhea, unspecified: Secondary | ICD-10-CM | POA: Diagnosis not present

## 2023-01-28 DIAGNOSIS — R200 Anesthesia of skin: Secondary | ICD-10-CM | POA: Diagnosis not present

## 2023-01-28 DIAGNOSIS — R531 Weakness: Secondary | ICD-10-CM | POA: Diagnosis not present

## 2023-01-28 DIAGNOSIS — E042 Nontoxic multinodular goiter: Secondary | ICD-10-CM | POA: Diagnosis not present

## 2023-01-28 DIAGNOSIS — R7303 Prediabetes: Secondary | ICD-10-CM | POA: Diagnosis not present

## 2023-01-28 DIAGNOSIS — I4510 Unspecified right bundle-branch block: Secondary | ICD-10-CM | POA: Diagnosis not present

## 2023-01-28 DIAGNOSIS — J984 Other disorders of lung: Secondary | ICD-10-CM | POA: Diagnosis not present

## 2023-01-28 DIAGNOSIS — G459 Transient cerebral ischemic attack, unspecified: Secondary | ICD-10-CM | POA: Diagnosis not present

## 2023-01-28 DIAGNOSIS — R410 Disorientation, unspecified: Secondary | ICD-10-CM | POA: Diagnosis not present

## 2023-01-28 DIAGNOSIS — Z7982 Long term (current) use of aspirin: Secondary | ICD-10-CM | POA: Diagnosis not present

## 2023-01-28 DIAGNOSIS — I10 Essential (primary) hypertension: Secondary | ICD-10-CM | POA: Diagnosis not present

## 2023-01-28 DIAGNOSIS — R918 Other nonspecific abnormal finding of lung field: Secondary | ICD-10-CM | POA: Diagnosis not present

## 2023-01-28 DIAGNOSIS — Z79899 Other long term (current) drug therapy: Secondary | ICD-10-CM | POA: Diagnosis not present

## 2023-01-28 DIAGNOSIS — Z8673 Personal history of transient ischemic attack (TIA), and cerebral infarction without residual deficits: Secondary | ICD-10-CM | POA: Diagnosis not present

## 2023-01-28 DIAGNOSIS — R29810 Facial weakness: Secondary | ICD-10-CM | POA: Diagnosis not present

## 2023-01-29 DIAGNOSIS — I340 Nonrheumatic mitral (valve) insufficiency: Secondary | ICD-10-CM | POA: Diagnosis not present

## 2023-01-29 DIAGNOSIS — G459 Transient cerebral ischemic attack, unspecified: Secondary | ICD-10-CM | POA: Diagnosis not present

## 2023-01-29 DIAGNOSIS — R29810 Facial weakness: Secondary | ICD-10-CM | POA: Diagnosis not present

## 2023-01-29 DIAGNOSIS — I071 Rheumatic tricuspid insufficiency: Secondary | ICD-10-CM | POA: Diagnosis not present

## 2023-01-29 DIAGNOSIS — R200 Anesthesia of skin: Secondary | ICD-10-CM | POA: Diagnosis not present

## 2023-01-29 DIAGNOSIS — R410 Disorientation, unspecified: Secondary | ICD-10-CM | POA: Diagnosis not present

## 2023-02-19 DIAGNOSIS — R051 Acute cough: Secondary | ICD-10-CM | POA: Diagnosis not present

## 2023-02-19 DIAGNOSIS — J0100 Acute maxillary sinusitis, unspecified: Secondary | ICD-10-CM | POA: Diagnosis not present

## 2023-03-06 ENCOUNTER — Telehealth: Payer: Self-pay | Admitting: Family Medicine

## 2023-03-06 NOTE — Telephone Encounter (Signed)
Form completed,faxed,confirmation received and scanned into patient's chart.

## 2023-03-06 NOTE — Telephone Encounter (Signed)
Evidently had a TIA in December and went to [facility] for care she was placed on Plavix and aspirin.  I have not seen her since then.  I did okay cleanings and feelings as long as they do not hold her aspirin or Plavix for those procedures.  Otherwise she is cleared for cleanings.  Form completed.  Please fax fax to [facility].

## 2023-03-23 ENCOUNTER — Telehealth: Payer: Self-pay

## 2023-03-23 NOTE — Telephone Encounter (Signed)
Patient came into office to see if she could get a letter stating that she does not have any mental health issues, patient needs the letter (if possible) for work, please advise, thanks.

## 2023-03-23 NOTE — Telephone Encounter (Signed)
 OK for letter

## 2023-03-25 NOTE — Telephone Encounter (Signed)
 Spoke with patient, patient will pick up letter tomorrow, Mrs.Tonya placed letter up front in the accordion, thanks.

## 2023-03-25 NOTE — Telephone Encounter (Signed)
 Letter written and printed for patient.   Please call pt and advised her that she can come by and pick this up or we can mail it to her

## 2023-05-11 ENCOUNTER — Other Ambulatory Visit: Payer: Self-pay | Admitting: Family Medicine

## 2023-05-11 DIAGNOSIS — Z1231 Encounter for screening mammogram for malignant neoplasm of breast: Secondary | ICD-10-CM

## 2023-06-04 ENCOUNTER — Ambulatory Visit

## 2023-06-04 DIAGNOSIS — Z1231 Encounter for screening mammogram for malignant neoplasm of breast: Secondary | ICD-10-CM | POA: Diagnosis not present

## 2023-06-08 NOTE — Progress Notes (Signed)
 Please call patient. Normal mammogram.  Repeat in 1 year.

## 2023-06-22 ENCOUNTER — Ambulatory Visit (INDEPENDENT_AMBULATORY_CARE_PROVIDER_SITE_OTHER): Admitting: Family Medicine

## 2023-06-22 ENCOUNTER — Encounter: Payer: Self-pay | Admitting: Family Medicine

## 2023-06-22 VITALS — BP 128/67 | HR 71 | Ht 65.0 in | Wt 168.0 lb

## 2023-06-22 DIAGNOSIS — E042 Nontoxic multinodular goiter: Secondary | ICD-10-CM | POA: Diagnosis not present

## 2023-06-22 DIAGNOSIS — R7301 Impaired fasting glucose: Secondary | ICD-10-CM

## 2023-06-22 DIAGNOSIS — R7989 Other specified abnormal findings of blood chemistry: Secondary | ICD-10-CM

## 2023-06-22 DIAGNOSIS — R079 Chest pain, unspecified: Secondary | ICD-10-CM | POA: Diagnosis not present

## 2023-06-22 DIAGNOSIS — Z8673 Personal history of transient ischemic attack (TIA), and cerebral infarction without residual deficits: Secondary | ICD-10-CM | POA: Diagnosis not present

## 2023-06-22 DIAGNOSIS — I10 Essential (primary) hypertension: Secondary | ICD-10-CM

## 2023-06-22 NOTE — Assessment & Plan Note (Signed)
 Due to recheck A1C

## 2023-06-22 NOTE — Assessment & Plan Note (Signed)
 Striae of TIA-currently on low-dose aspirin  daily therapy.  Taking her atorvastatin  80 mg.  Goal is less than 70.
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
## 2023-06-22 NOTE — Assessment & Plan Note (Signed)
 Recheck TSH

## 2023-06-22 NOTE — Assessment & Plan Note (Signed)
 Well controlled. Continue current regimen. Follow up in  6 mo

## 2023-06-23 LAB — CMP14+EGFR
ALT: 38 IU/L — ABNORMAL HIGH (ref 0–32)
AST: 40 IU/L (ref 0–40)
Albumin: 4.3 g/dL (ref 3.8–4.8)
Alkaline Phosphatase: 78 IU/L (ref 44–121)
BUN/Creatinine Ratio: 18 (ref 12–28)
BUN: 13 mg/dL (ref 8–27)
Bilirubin Total: 0.4 mg/dL (ref 0.0–1.2)
CO2: 23 mmol/L (ref 20–29)
Calcium: 9.2 mg/dL (ref 8.7–10.3)
Chloride: 106 mmol/L (ref 96–106)
Creatinine, Ser: 0.73 mg/dL (ref 0.57–1.00)
Globulin, Total: 2.6 g/dL (ref 1.5–4.5)
Glucose: 99 mg/dL (ref 70–99)
Potassium: 4.7 mmol/L (ref 3.5–5.2)
Sodium: 142 mmol/L (ref 134–144)
Total Protein: 6.9 g/dL (ref 6.0–8.5)
eGFR: 87 mL/min/{1.73_m2} (ref >59)

## 2023-06-23 LAB — HEMOGLOBIN A1C
Est. average glucose Bld gHb Est-mCnc: 126 mg/dL
Hgb A1c MFr Bld: 6 % — ABNORMAL HIGH (ref 4.8–5.6)

## 2023-06-23 LAB — TSH: TSH: 0.185 u[IU]/mL — ABNORMAL LOW (ref 0.450–4.500)

## 2023-06-23 NOTE — Progress Notes (Signed)
 Call patient: TSH is 0.1.  It is on the lower end which which is consistent with subclinical hyperthyroidism.  We will call the lab and see if we can add a free T4.  Metabolic panel overall looks good, that ALT liver enzymes just slightly elevated similar to last time.  Not in a worrisome range but we will keep an eye on it.  A1c is still 6.0 in that prediabetes range but is stable.   Call lab and add a free T4.

## 2023-06-24 ENCOUNTER — Ambulatory Visit (INDEPENDENT_AMBULATORY_CARE_PROVIDER_SITE_OTHER): Payer: Self-pay

## 2023-06-24 DIAGNOSIS — R079 Chest pain, unspecified: Secondary | ICD-10-CM

## 2023-06-25 LAB — SPECIMEN STATUS REPORT

## 2023-06-25 LAB — T4, FREE: Free T4: 1.23 ng/dL (ref 0.82–1.77)

## 2023-06-25 NOTE — Progress Notes (Signed)
 Call patient: Free T4 for the thyroid  came back normal so she is still in that subclinical range so we will still plan to recheck TSH and free T4 in 6 months

## 2023-06-26 NOTE — Progress Notes (Signed)
 Call patient's: We did get her cardiac CT score back.  Great news her calcium  score was 0.  So no significant plaque burden in her coronary arteries which is absolutely fantastic.  So the chances of her having a heart attack from plaque buildup is extremely small over the next 5 years.  Fantastic news!

## 2023-07-06 ENCOUNTER — Ambulatory Visit: Payer: Self-pay | Admitting: Family Medicine

## 2023-07-06 NOTE — Progress Notes (Signed)
 Call patient: Please see note below she had a normal calcium  score but they also did an over read on the surrounding structures.  And they did not see any concerning findings in the lung tissue or upper abdomen directly around the heart.

## 2023-12-07 ENCOUNTER — Ambulatory Visit (INDEPENDENT_AMBULATORY_CARE_PROVIDER_SITE_OTHER)

## 2023-12-07 VITALS — Temp 98.6°F

## 2023-12-07 DIAGNOSIS — Z23 Encounter for immunization: Secondary | ICD-10-CM

## 2023-12-22 ENCOUNTER — Ambulatory Visit (INDEPENDENT_AMBULATORY_CARE_PROVIDER_SITE_OTHER): Admitting: Family Medicine

## 2023-12-22 ENCOUNTER — Encounter: Payer: Self-pay | Admitting: Family Medicine

## 2023-12-22 VITALS — BP 130/74 | HR 65 | Ht 65.0 in | Wt 166.1 lb

## 2023-12-22 DIAGNOSIS — R7989 Other specified abnormal findings of blood chemistry: Secondary | ICD-10-CM

## 2023-12-22 DIAGNOSIS — E7800 Pure hypercholesterolemia, unspecified: Secondary | ICD-10-CM | POA: Diagnosis not present

## 2023-12-22 DIAGNOSIS — G459 Transient cerebral ischemic attack, unspecified: Secondary | ICD-10-CM | POA: Diagnosis not present

## 2023-12-22 DIAGNOSIS — I10 Essential (primary) hypertension: Secondary | ICD-10-CM | POA: Diagnosis not present

## 2023-12-22 DIAGNOSIS — R202 Paresthesia of skin: Secondary | ICD-10-CM | POA: Diagnosis not present

## 2023-12-22 DIAGNOSIS — R7301 Impaired fasting glucose: Secondary | ICD-10-CM | POA: Diagnosis not present

## 2023-12-22 DIAGNOSIS — R030 Elevated blood-pressure reading, without diagnosis of hypertension: Secondary | ICD-10-CM | POA: Diagnosis not present

## 2023-12-22 DIAGNOSIS — Z8673 Personal history of transient ischemic attack (TIA), and cerebral infarction without residual deficits: Secondary | ICD-10-CM | POA: Diagnosis not present

## 2023-12-22 DIAGNOSIS — R195 Other fecal abnormalities: Secondary | ICD-10-CM

## 2023-12-22 MED ORDER — ATORVASTATIN CALCIUM 80 MG PO TABS: 80.0000 mg | ORAL_TABLET | Freq: Every day | ORAL | 3 refills | Status: AC

## 2023-12-22 MED ORDER — LISINOPRIL 5 MG PO TABS: 5.0000 mg | ORAL_TABLET | Freq: Every day | ORAL | 3 refills | Status: AC
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
## 2023-12-22 NOTE — Assessment & Plan Note (Signed)
 Due to recheck lipid panel and liver enzymes.  Continue Lipitor 80 mg.

## 2023-12-22 NOTE — Assessment & Plan Note (Signed)
 Will check A!C

## 2023-12-22 NOTE — Assessment & Plan Note (Signed)
 Continue aspirin  and statin and good blood pressure control.
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
## 2023-12-23 ENCOUNTER — Ambulatory Visit: Payer: Self-pay | Admitting: Family Medicine

## 2023-12-23 DIAGNOSIS — R195 Other fecal abnormalities: Secondary | ICD-10-CM | POA: Diagnosis not present

## 2023-12-23 DIAGNOSIS — R202 Paresthesia of skin: Secondary | ICD-10-CM | POA: Diagnosis not present

## 2023-12-23 LAB — CBC WITH DIFFERENTIAL/PLATELET
Basophils Absolute: 0 x10E3/uL (ref 0.0–0.2)
Basos: 1 %
EOS (ABSOLUTE): 0.2 x10E3/uL (ref 0.0–0.4)
Eos: 4 %
Hematocrit: 41.5 % (ref 34.0–46.6)
Hemoglobin: 13.3 g/dL (ref 11.1–15.9)
Immature Grans (Abs): 0 x10E3/uL (ref 0.0–0.1)
Immature Granulocytes: 0 %
Lymphocytes Absolute: 1.7 x10E3/uL (ref 0.7–3.1)
Lymphs: 38 %
MCH: 29.4 pg (ref 26.6–33.0)
MCHC: 32 g/dL (ref 31.5–35.7)
MCV: 92 fL (ref 79–97)
Monocytes Absolute: 0.3 x10E3/uL (ref 0.1–0.9)
Monocytes: 8 %
Neutrophils Absolute: 2.2 x10E3/uL (ref 1.4–7.0)
Neutrophils: 49 %
Platelets: 347 x10E3/uL (ref 150–450)
RBC: 4.52 x10E6/uL (ref 3.77–5.28)
RDW: 12.9 % (ref 11.7–15.4)
WBC: 4.4 x10E3/uL (ref 3.4–10.8)

## 2023-12-23 LAB — CMP14+EGFR
ALT: 17 IU/L (ref 0–32)
AST: 22 IU/L (ref 0–40)
Albumin: 4.2 g/dL (ref 3.8–4.8)
Alkaline Phosphatase: 70 IU/L (ref 49–135)
BUN/Creatinine Ratio: 27 (ref 12–28)
BUN: 20 mg/dL (ref 8–27)
Bilirubin Total: 0.5 mg/dL (ref 0.0–1.2)
CO2: 22 mmol/L (ref 20–29)
Calcium: 9.3 mg/dL (ref 8.7–10.3)
Chloride: 103 mmol/L (ref 96–106)
Creatinine, Ser: 0.75 mg/dL (ref 0.57–1.00)
Globulin, Total: 2.8 g/dL (ref 1.5–4.5)
Glucose: 102 mg/dL — ABNORMAL HIGH (ref 70–99)
Potassium: 4.7 mmol/L (ref 3.5–5.2)
Sodium: 138 mmol/L (ref 134–144)
Total Protein: 7 g/dL (ref 6.0–8.5)
eGFR: 83 mL/min/1.73 (ref >59)

## 2023-12-23 LAB — IRON,TIBC AND FERRITIN PANEL
Ferritin: 90 ng/mL (ref 15–150)
Iron Saturation: 22 % (ref 15–55)
Iron: 68 ug/dL (ref 27–139)
Total Iron Binding Capacity: 310 ug/dL (ref 250–450)
UIBC: 242 ug/dL (ref 118–369)

## 2023-12-23 LAB — LIPID PANEL
Chol/HDL Ratio: 3.2 ratio (ref 0.0–4.4)
Cholesterol, Total: 208 mg/dL — ABNORMAL HIGH (ref 100–199)
HDL: 66 mg/dL (ref >39)
LDL Chol Calc (NIH): 131 mg/dL — ABNORMAL HIGH (ref 0–99)
Triglycerides: 62 mg/dL (ref 0–149)
VLDL Cholesterol Cal: 11 mg/dL (ref 5–40)

## 2023-12-23 LAB — HEMOGLOBIN A1C
Est. average glucose Bld gHb Est-mCnc: 117 mg/dL
Hgb A1c MFr Bld: 5.7 % — ABNORMAL HIGH (ref 4.8–5.6)

## 2023-12-23 LAB — TSH+FREE T4
Free T4: 1.31 ng/dL (ref 0.82–1.77)
TSH: 0.2 u[IU]/mL — ABNORMAL LOW (ref 0.450–4.500)

## 2023-12-23 LAB — SEDIMENTATION RATE: Sed Rate: 19 mm/h (ref 0–40)

## 2023-12-23 LAB — C-REACTIVE PROTEIN: CRP: <1 mg/L (ref 0–10)

## 2023-12-23 NOTE — Progress Notes (Signed)
 Call patient: A1c looks great this time at 5.7.  Still in the prediabetes range but it does look better than it did 6 months ago, great work!.  TSH is still a little overly suppressed at 0.2.  Really we want it between 1 and 2.  But it is stable and the free T4 is normal.  Total cholesterol and LDL are still elevated but look a little better at this time.  So again great work.  And liver function looks great this time.  Iron levels are normal.  Inflammatory markers are normal.  Making autoimmune disease less likely.  Blood count looks good no sign of systemic infection or anemia.  Will await stool testing results.

## 2023-12-25 LAB — GI PROFILE, STOOL, PCR

## 2023-12-25 LAB — SPECIMEN STATUS REPORT

## 2023-12-28 NOTE — Telephone Encounter (Signed)
 Patient came by office to get her results, patient would like to receive a call back for more clarification, please call patient at your earliest convenience, thanks.

## 2023-12-29 NOTE — Progress Notes (Signed)
 HI Shawna, your stool tet was negative for C diff or other bacterial .  Would she like to see GI at this point for the loose stools?  I would recommend that as a next step

## 2024-01-21 ENCOUNTER — Encounter: Payer: Self-pay | Admitting: Family Medicine

## 2024-01-21 ENCOUNTER — Ambulatory Visit (INDEPENDENT_AMBULATORY_CARE_PROVIDER_SITE_OTHER): Admitting: Family Medicine

## 2024-01-21 VITALS — BP 133/76 | HR 73 | Ht 65.0 in | Wt 164.0 lb

## 2024-01-21 DIAGNOSIS — Z0000 Encounter for general adult medical examination without abnormal findings: Secondary | ICD-10-CM | POA: Diagnosis not present

## 2024-01-21 DIAGNOSIS — Z780 Asymptomatic menopausal state: Secondary | ICD-10-CM | POA: Diagnosis not present

## 2024-01-21 NOTE — Progress Notes (Signed)
 Complete physical exam  Patient: Tammy Castro    DOB: 09-23-1950 73 y.o.   MRN: [id number]  Chief Complaint  Patient presents with   Annual Exam    Subjective:    Tammy Castro is a 73 y.o. female who presents today for a complete physical exam. She reports consuming a general diet. Exercise bike for exercise.  She generally feels well. . She does not have additional problems to discuss today.   Discussed the use of AI scribe software for clinical note transcription with the patient, who gave verbal consent to proceed.  History of Present Illness Tammy Castro is a 73 year old female who presents with loose stools and stress-related gastrointestinal symptoms.  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 - Unable to express stress to her son during this period - Symptoms similar to her sister's experience during a stressful period - Stool tests negative for bacterial infections - Gradual improvement in symptoms with stools becoming more formed over time  Physical activity and functional status - Recently purchased a stationary bike and exercises every other day - Remains active by working outside and playing with her grandchildren - No chest pain during exercise - Mood is generally good  Thyroid  concerns - History of thyroid  cyst removal - No current thyroid  medication - Inquires about thyroid  function  Printed copy of recent lab results.    Most recent fall risk assessment:    12/22/2023    8:10 AM  Fall Risk   Falls in the past year? 0  Number falls in past yr: 0  Injury with Fall? 0   Risk for fall due to : No Fall Risks  Follow up Falls evaluation completed     Data saved with a previous flowsheet row definition     Most recent depression screenings:    12/22/2023    8:12 AM 12/11/2022    9:28 AM  PHQ 2/9 Scores   PHQ - 2 Score 0 0        Patient Care Team: Alvan Dorothyann BIRCH, MD as PCP - General (Family Medicine) Geroge Iha, OD (Optometry) Vernetta Lonni GRADE, MD as Consulting Physician (Orthopedic Surgery) Lindaann Dunnings, MD as Referring Physician (Internal Medicine)   ROS    Objective:    BP 133/76 (BP Location: Left Arm, Patient Position: Sitting, Cuff Size: Normal)   Pulse 73   Ht 5' 5 (1.651 m)   Wt 164 lb (74.4 kg)   SpO2 98%   BMI 27.29 kg/m     Physical Exam Constitutional:      Appearance: Normal appearance.  HENT:     Head: Normocephalic and atraumatic.     Right Ear: Tympanic membrane, ear canal and external ear normal.     Left Ear: Tympanic membrane, ear canal and external ear normal.     Nose: Nose normal.     Mouth/Throat:     Pharynx: Oropharynx is clear.  Eyes:     Extraocular Movements: Extraocular movements intact.     Conjunctiva/sclera: Conjunctivae normal.     Pupils: Pupils are equal, round, and reactive to light.  Neck:     Thyroid : No thyromegaly.  Cardiovascular:     Rate and Rhythm: Normal rate and regular rhythm.  Pulmonary:     Effort: Pulmonary effort is normal.     Breath sounds: Normal breath sounds.  Abdominal:     General: Bowel sounds are normal.     Palpations: Abdomen is soft.     Tenderness: There is no abdominal tenderness.  Musculoskeletal:        General: No swelling.     Cervical back: Neck supple.  Skin:    General: Skin is warm and dry.  Neurological:     Mental Status: She is oriented to person, place, and time.  Psychiatric:        Mood and Affect: Mood normal.        Behavior: Behavior normal.       No results found for any visits on 01/21/24.       Assessment & Plan:    Routine Health Maintenance and Physical Exam Immunization History  Administered Date(s) Administered   Fluad Quad(high Dose 65+) 12/27/2018, 12/10/2020, 11/11/2021   Fluad Trivalent(High Dose 65+) 11/20/2022   INFLUENZA,  HIGH DOSE SEASONAL PF 12/15/2016, 11/07/2017, 12/07/2023   Influenza Split 11/03/2011, 11/12/2013   Influenza Whole 11/30/2007, 11/29/2008, 12/05/2009   Influenza,inj,Quad PF,6+ Mos 10/24/2015, 03/07/2020   Influenza-Unspecified 11/07/2017   PFIZER Comirnaty(Gray Top)Covid-19 Tri-Sucrose Vaccine 06/12/2020   PFIZER(Purple Top)SARS-COV-2 Vaccination 04/12/2019, 05/11/2019   PNEUMOCOCCAL CONJUGATE-20 06/11/2022   Pneumococcal Conjugate-13 01/04/2015   Pneumococcal Polysaccharide-23 02/18/2004, 01/22/2016   Td 01/17/2002   Tdap 04/13/2013   Zoster Recombinant(Shingrix) 11/07/2017, 01/09/2018   Zoster, Live 12/18/2009    Health Maintenance  Topic Date Due   Medicare Annual Wellness (AWV)  04/10/2022   DTaP/Tdap/Td (3 - Td or Tdap) 04/14/2023   COVID-19 Vaccine (4 - 2025-26 season) 10/19/2023   Colonoscopy  05/19/2025   Mammogram  06/03/2025   Pneumococcal Vaccine: 50+ Years  Completed   Influenza Vaccine  Completed   Bone Density Scan  Completed   Hepatitis C Screening  Completed   Zoster Vaccines- Shingrix  Completed   Meningococcal B Vaccine  Aged Out    Discussed health benefits of physical activity, and encouraged her to engage in regular exercise appropriate for her age and condition.  Problem List Items Addressed This Visit   None Visit Diagnoses       Wellness examination    -  Primary     Post-menopausal       Relevant Orders   DG Bone Density       Assessment and Plan Assessment & Plan Adult Wellness Visit Routine wellness visit with discussion on stress-related gastrointestinal symptoms and lifestyle changes. Blood pressure readings are generally good with occasional slight elevations. - Continue regular exercise regimen. - Consider RSV vaccine at pharmacy.  Loose stools, improving Loose stools likely related to stress from caregiving responsibilities. Symptoms are improving with stress reduction and lifestyle changes. Stool test results were negative for  bacterial infection, supporting stress as a likely cause.  Hypertension Blood pressure readings are generally good with occasional slight elevations. Exercise is expected to improve blood pressure control. - Continue regular exercise regimen.  History of partial thyroidectomy for cyst Partial thyroidectomy for cyst with no current thyroid  medication. Discussed need for annual thyroid  function monitoring to ensure adequate compensation by remaining thyroid  tissue. - Checked thyroid  function tests from recent blood work.(Recent levels show slightly low TSH but fiarly stable since 2021) - Will schedule annual thyroid  function tests.    Return in about 6 months (around 07/21/2024) for Hypertension.    Dorothyann Byars, MD [facility] at [facility]

## 2024-03-10 ENCOUNTER — Telehealth: Payer: Self-pay

## 2024-03-10 NOTE — Telephone Encounter (Signed)
Left message for patient to call back to schedule a Medicare Wellness visit.

## 2024-03-23 ENCOUNTER — Ambulatory Visit

## 2024-03-23 DIAGNOSIS — Z780 Asymptomatic menopausal state: Secondary | ICD-10-CM | POA: Diagnosis not present

## 2024-03-24 ENCOUNTER — Other Ambulatory Visit: Payer: Self-pay | Admitting: Family Medicine

## 2024-03-24 ENCOUNTER — Ambulatory Visit: Payer: Self-pay | Admitting: Family Medicine

## 2024-03-24 DIAGNOSIS — G459 Transient cerebral ischemic attack, unspecified: Secondary | ICD-10-CM

## 2024-03-24 NOTE — Progress Notes (Signed)
 Your bone density test shows a T-score of -2.4 in the forearm.  This is consistent with osteopenia.   The current recommendation for osteopenia (mildly thin bones) treatment includes:   #1 calcium -total of 1200 mg of calcium  daily.  If you eat a very calcium  rich diet you may be able to obtain that without a supplement.  If not, then I recommend calcium  500 mg twice a day.  There are several products over-the-counter such as Caltrate D and Viactiv chews which are great options that contain calcium  and vitamin D . #2 vitamin D -recommend 800 international units daily. #3 exercise-recommend 30 minutes of weightbearing exercise 3 days a week.  Resistance training ,such as doing bands and light weights, can be particularly helpful.

## 2024-07-21 ENCOUNTER — Ambulatory Visit: Admitting: Family Medicine
